# Patient Record
Sex: Female | Born: 2000 | Race: White | Hispanic: No | Marital: Single | Smoking: Never smoker
Health system: Southern US, Community
[De-identification: ages and names within clinical notes are randomized; demographics above are authoritative.]

## PROBLEM LIST (undated history)

## (undated) DIAGNOSIS — F419 Anxiety disorder, unspecified: Secondary | ICD-10-CM

## (undated) DIAGNOSIS — E119 Type 2 diabetes mellitus without complications: Secondary | ICD-10-CM

## (undated) DIAGNOSIS — F32A Depression, unspecified: Secondary | ICD-10-CM

## (undated) HISTORY — DX: Type 2 diabetes mellitus without complications: E11.9

## (undated) HISTORY — DX: Anxiety disorder, unspecified: F41.9

## (undated) HISTORY — DX: Depression, unspecified: F32.A

---
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
## 2020-01-05 ENCOUNTER — Other Ambulatory Visit (HOSPITAL_COMMUNITY): Payer: Self-pay | Admitting: Psychiatry

## 2020-01-05 DIAGNOSIS — F410 Panic disorder [episodic paroxysmal anxiety] without agoraphobia: Secondary | ICD-10-CM

## 2020-02-15 ENCOUNTER — Other Ambulatory Visit (INDEPENDENT_AMBULATORY_CARE_PROVIDER_SITE_OTHER): Payer: Self-pay | Admitting: Pediatrics

## 2020-02-15 DIAGNOSIS — G2569 Other tics of organic origin: Secondary | ICD-10-CM

## 2020-03-01 ENCOUNTER — Ambulatory Visit (INDEPENDENT_AMBULATORY_CARE_PROVIDER_SITE_OTHER): Payer: Medicaid Other | Admitting: Pediatrics

## 2020-03-06 ENCOUNTER — Telehealth (INDEPENDENT_AMBULATORY_CARE_PROVIDER_SITE_OTHER): Payer: Medicaid Other | Admitting: Pediatrics

## 2020-03-08 ENCOUNTER — Telehealth (INDEPENDENT_AMBULATORY_CARE_PROVIDER_SITE_OTHER): Payer: Medicaid Other | Admitting: Psychiatry

## 2020-03-08 ENCOUNTER — Encounter (HOSPITAL_COMMUNITY): Payer: Self-pay | Admitting: Psychiatry

## 2020-03-08 ENCOUNTER — Other Ambulatory Visit: Payer: Self-pay

## 2020-03-08 DIAGNOSIS — F410 Panic disorder [episodic paroxysmal anxiety] without agoraphobia: Secondary | ICD-10-CM

## 2020-03-08 MED ORDER — HYDROXYZINE HCL 25 MG PO TABS: 25.0000 mg | ORAL_TABLET | Freq: Three times a day (TID) | ORAL | 2 refills | Status: DC

## 2020-03-08 MED ORDER — SERTRALINE HCL 100 MG PO TABS: ORAL_TABLET | ORAL | 2 refills | Status: DC

## 2020-03-08 NOTE — Progress Notes (Signed)
BH MD/PA/NP OP Progress Note Virtual Visit via Video Note  I connected with Annell Greening on 03/08/20 at  2:00 PM EST by a video enabled telemedicine application and verified that I am speaking with the correct person using two identifiers.  Location: Patient: Home Provider: Clinic   I discussed the limitations of evaluation and management by telemedicine and the availability of in person appointments. The patient expressed understanding and agreed to proceed.  I provided 30 minutes of non-face-to-face time during this encounter.    03/08/2020 4:29 PM Yilin Weedon  MRN:  [id number]  Chief Complaint: "Things are not so good"  HPI: 20 year old female seen today for follow-up psychiatric evaluation.  Patient has a psychiatric history of ADHD, depression, Tic disorder and severe panic with anxiety. Patient is currently prescribed Pimozide1 mg two times a day(recieves from PCP), Zoloft 150 mg daily, and  hydroxyzine 25 mg three times daily. She notes her medications are effective in managing her psychiatric conditions however notes that she has been feeling sad this week.   Today she is well-groomed, pleasant, cooperative, engaged in conversation, and maintains eye contact.  She informed provider that things are yesterday her boyfriend broke up with her because he wants to focus on school and himself. She notes this saddens her and endorses having increased anxiety and depression. Today provider conducted a GAD 7  and patient scored a 10. Provider also conducted a PHQ 9 and patient scored a 14. Patient endorses passive SI but denies wanting to hurt herself. She denies  SI/HI/AVH or paranoia.    No medication changes made today.  Patient agreeable to continue all medications as prescribed.  She will follow-up with counselor for therapy follow-up for counselinf.  No other concerns noted at this time.  Visit Diagnosis:    ICD-10-CM   1. Severe anxiety with panic  F41.0 sertraline (ZOLOFT) 100 MG  tablet    hydrOXYzine (ATARAX/VISTARIL) 25 MG tablet    Past Psychiatric History: ADHD, depression, personality disorder   Past Medical History: No past medical history on file. No past surgical history on file.  Family Psychiatric History: Paternal grandfather alcohol abuse, paternal uncle ploy substance abuse and suicide, maternal grandmother anxiety    Family History:  Family History  Problem Relation Age of Onset  . Pancreatitis Paternal Grandfather        Died at 56    Social History:  Social History   Socioeconomic History  . Marital status: Single    Spouse name: Not on file  . Number of children: Not on file  . Years of education: Not on file  . Highest education level: Not on file  Occupational History  . Not on file  Tobacco Use  . Smoking status: Never Smoker  . Smokeless tobacco: Never Used  Substance and Sexual Activity  . Alcohol use: No    Alcohol/week: 0.0 standard drinks  . Drug use: No  . Sexual activity: Never  Other Topics Concern  . 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 She is currently not in school   She lives with her parents and siblings.    She enjoys playing on her phone and sleeping.   Social Determinants of Health   Financial Resource Strain: Not on file  Food Insecurity: Not on file  Transportation Needs: Not on file  Physical Activity: Not on file  Stress: Not on file  Social  Connections: Not on file    Allergies: No Known Allergies  Metabolic Disorder Labs: No results found for: HGBA1C, MPG No results found for: PROLACTIN No results found for: CHOL, TRIG, HDL, CHOLHDL, VLDL, LDLCALC No results found for: TSH  Therapeutic Level Labs: No results found for: LITHIUM No results found for: VALPROATE No components found for:  CBMZ  Current Medications: Current Outpatient Medications  Medication Sig Dispense Refill  . docusate sodium  (COLACE) 100 MG capsule Take 100 mg by mouth 2 (two) times daily.    . drospirenone-ethinyl estradiol (NIKKI) 3-0.02 MG tablet TAKE 1 TABLET BY MOUTH EVERY DAY    . hydrOXYzine (ATARAX/VISTARIL) 25 MG tablet Take 1 tablet (25 mg total) by mouth 3 (three) times daily. 90 tablet 2  . loratadine (CLARITIN) 10 MG tablet Take 10 mg by mouth daily.  3  . omeprazole (PRILOSEC) 40 MG capsule   0  . Pimozide 1 MG TABS TAKE 1 TABLET BY MOUTH TWICE A DAY 60 tablet 0  . ranitidine (ZANTAC) 150 MG tablet Take 150 mg by mouth 2 (two) times daily.  0  . sertraline (ZOLOFT) 100 MG tablet TAKE 1.5 TABLETS BY MOUTH DAILY 135 tablet 2   No current facility-administered medications for this visit.     Musculoskeletal: Strength & Muscle Tone: Unable to assess due to telehealth visit Gait & Station: Unable to assess due to telehealth visit Patient leans: N/A  Psychiatric Specialty Exam: Review of Systems  There were no vitals taken for this visit.There is no height or weight on file to calculate BMI.  General Appearance: Well Groomed  Eye Contact:  Good  Speech:  Clear and Coherent and Normal Rate  Volume:  Normal  Mood:  Anxious and Depressed  Affect:  Congruent  Thought Process:  Coherent, Goal Directed and Linear  Orientation:  Full (Time, Place, and Person)  Thought Content: WDL   Suicidal Thoughts:  No  Homicidal Thoughts:  No  Memory:  Immediate;   Good Recent;   Good Remote;   Good  Judgement:  Fair  Insight:  Good  Psychomotor Activity:  Normal  Concentration:  Concentration: Good and Attention Span: Good  Recall:  Good  Fund of Knowledge: Good  Language: Good  Akathisia:  No  Handed:  Right  AIMS (if indicated): Not done  Assets:  Communication Skills Desire for Improvement Financial Resources/Insurance Housing Social Support  ADL's:  Intact  Cognition: WNL  Sleep:  Good   Screenings: GAD-7   Flowsheet Row [facility]08/2020 in [facility]  Total GAD-7 Score 10    PHQ2-9   Flowsheet Row Video Visit [facility] 03/08/2020 in [facility]  PHQ-2 Total Score 4  PHQ-9 Total Score 14       Assessment and Plan: Patient that she has been more anxious and depressed since the break up of her and her boyfriend yesterday. No medication changes made today. Patient agreeable to continue medication as prescribed.  1. Severe anxiety with panic  Continue- sertraline (ZOLOFT) 100 MG tablet; TAKE 1.5 TABLETS BY MOUTH DAILY  Dispense: 135 tablet; Refill: 2 Continue- hydrOXYzine (ATARAX/VISTARIL) 25 MG tablet; Take 1 tablet (25 mg total) by mouth 3 (three) times daily.  Dispense: 90 tablet; Refill: 2    Follow up in 3 month Follow-up with therapy    Shanna Cisco, NP 03/08/2020, 4:29 PM

## 2020-03-17 ENCOUNTER — Encounter (INDEPENDENT_AMBULATORY_CARE_PROVIDER_SITE_OTHER): Payer: Self-pay | Admitting: Pediatrics

## 2020-03-17 ENCOUNTER — Telehealth (INDEPENDENT_AMBULATORY_CARE_PROVIDER_SITE_OTHER): Payer: Medicaid Other | Admitting: Pediatrics

## 2020-03-17 DIAGNOSIS — G2569 Other tics of organic origin: Secondary | ICD-10-CM | POA: Diagnosis not present

## 2020-03-17 MED ORDER — PIMOZIDE 1 MG PO TABS: 1.0000 mg | ORAL_TABLET | Freq: Two times a day (BID) | ORAL | 5 refills | Status: DC

## 2020-03-17 NOTE — Patient Instructions (Signed)
Thank you for persisting so that we could see you today.  I think it would be a good idea for you to see a rheumatologist although I do not think that you have rheumatologic disease.  I do not have a lot of options given that she could not tolerate duloxetine.  I am glad that you are tics are doing well.  I am not going to make changes in pimozide.

## 2020-03-17 NOTE — Progress Notes (Signed)
This is a Pediatric Specialist E-Visit follow up consult provided via Caregility video Marnita Poirier and their parent/guardian Jessaca consented to an E-Visit consult today.  Location of patient: Erica Mcknight is at Home (location) Location of provider: Ellison Carwin, MD is at Office (location) Patient was referred by Clementeen Hoof, NP   The following participants were involved in this E-Visit: [name], her mother, CMA      Ellison Carwin, MD  Total time on call: 20 minutes Follow up: 6 months  Patient: Erica Mcknight MRN: [id number] Sex: female DOB: 2000/09/21  Provider: Ellison Carwin, MD Location of Care: [facility]  Note type: Routine return visit  History of Present Illness: History from: patient and [facility] chart Chief Complaint: Motor tics/musculoskeletal pain/excessive somnolence  Erica Mcknight is a 20 y.o. female who was evaluated March 17, 2020 for the first time since August 24, 2019.  Mirel has diffuse neck and back pain and pain in her arms that is dull and achy.  She also has sharp pains in her right foot which come and go.  She has a history of headaches that she did not complain about today.  These symptoms seem to be quite similar to those that we discussed 6 months ago.  Her motor tics largely involve eyelids which are quite obvious.  On occasion other muscles are involved.  There are no vocal tics.  She stays at home.  She is not going to school nor is she working.  Her general health has been stable.  Her Crohn's disease is not active although she has experienced problems with constipation that required an ED visit.  She has been vaccinated twice for Covid and has a booster scheduled in February.  She has a lump on her breast which will be evaluated by ultrasound in the near future.  She is obese and has acanthosis nigricans she had hemoglobin A1c of 5.3 in October 2021 and a glucose of 105.  Pain keeps her awake her makes it difficult for her to fall  asleep.  She has ongoing problems with sleep.  I tried duloxetine previously but she was not able to tolerate it.  Her mother says that she has been seen by rheumatologist and I think that that is a reasonable idea though I suspect that no rheumatologic disease will be found.  It strange that the history that she gave to my CMA (reflected in the review of systems) is much different from the history that was given to me.  Review of Systems: A complete review of systems was remarkable for patient is here to be seen for fibromyalgia. She reports that she has been doing well since her last visit. SHe states that she has not had any pain really and the medication hasbeen working great. She reports no concerns at this time., all other systems reviewed and negative.  Past Medical History Diagnosis Date  . Anxiety    Phreesia 03/16/2020  . Depression    Phreesia 03/16/2020  . Diabetes mellitus without complication (HCC)    Phreesia 03/16/2020   Hospitalizations: No., Head Injury: No., Nervous System Infections: No., Immunizations up to date: Yes.    Birth History 3 pound infant born at 36 weeks' gestational age to 20 year old gravida 4 para 60 female. Mother had excessive vomiting and was underweight for 5 months. She had spotting throughout the whole pregnancy. She had a cerclage placed between the fifth and sixth months. Labor lasted for 9 hours. Mother received epidural anesthesia. Normal  spontaneous vaginal delivery. The patient had jaundice requiring phototherapy. She did not require a ventilator. She had feeding difficulties. Mother thinks that the ultrasound was normal. The child was fed Enfamil. Growth and development appears to be normal for gross and fine motor skills and language.  Behavior History Attention deficit hyperactivity disorder, combined type, depression  Surgical History History reviewed. No pertinent surgical history.   EGD and colonoscopy January 06, 2020 results  are unknown to me.  Family History family history includes Pancreatitis in her paternal grandfather. Family history is negative for migraines, seizures, intellectual disabilities, blindness, deafness, birth defects, chromosomal disorder, or autism.  Social History Socioeconomic History  . Marital status: Single  . Years of education:  !5  . Highest education level:  Dropped out of high school GED at community college?  Occupational History  . Not employed  Tobacco Use  . Smoking status: Never Smoker  . Smokeless tobacco: Never Used  Substance and Sexual Activity  . Alcohol use: No    Alcohol/week: 0.0 standard drinks  . Drug use: No  . 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 She is currently not in school    She lives with her parents and siblings.     She enjoys playing on her phone and sleeping.   No Known Allergies  Physical Exam Ht 5\' 7"  (1.702 m)   Wt 274 lb (124.3 kg)   BMI 42.91 kg/m   General: alert, well developed, well nourished, in no acute distress, blond hair, blue eyes, right handed Head: normocephalic, no dysmorphic features Neck: supple, full range of motion Musculoskeletal: no skeletal deformities or apparent scoliosis Skin: no rashes or neurocutaneous lesions  Neurologic Exam  Mental Status: alert; oriented to person, place and year; knowledge is normal for age; language is normal Cranial Nerves: visual fields are full to double simultaneous stimuli; extraocular movements are full and conjugate; symmetric facial strength; frequent eyelid blinking; midline tongue and uvula; hearing appears normal bilaterally Motor: normal functional strength, tone and mass; good fine motor movements; no pronator drift Coordination: good finger-to-nose, rapid repetitive alternating movements and finger apposition Gait and Station: normal gait and station: patient is able to walk  on heels, toes and tandem without difficulty; balance is adequate; Romberg exam is negative; Gower response is negative  Assessment 1. Tics of organic origin, G25.69. 2. Fibromyalgia, M79.7. 3. Acanthosis nigricans, L83.  Discussion I have Re: appears to be stable medically and neurologically.  The complaints that she had today are similar to those that she had 6 months ago.  It is interesting that she told my CMA that she was doing well then began to tell me about her aches and pains.  Plan I think it is worthwhile for her to be seen by rheumatologist but I have seen no evidence of rheumatologic disease in terms of joint swelling localized tenderness or erythema.  I think this may represent fibromyalgia but other than duloxetine I am not sure how we might treat it.  I will plan to see her in 6 months.  After that time she will need to see an adult neurologist.  There are many options including the neurology group in [facility] and [facility].  She already has providers both in [place] and at [place].  Greater than 50% of a 20-minute visit was spent in counseling coordination of care concerning her medical problems, reviewing her chart,  and discussing transition of care.  I refilled her prescription for pimozide.   Medication List   Accurate as of March 17, 2020 11:59 PM. If you have any questions, ask your nurse or doctor.    cyanocobalamin 1000 MCG/ML injection Commonly known as: (VITAMIN B-12) Inject into the muscle.   docusate sodium 100 MG capsule Commonly known as: COLACE Take 100 mg by mouth 2 (two) times daily.   doxycycline 100 MG capsule Commonly known as: VIBRAMYCIN Take by mouth.   drospirenone-ethinyl estradiol 3-0.02 MG tablet Commonly known as: YAZ TAKE 1 TABLET BY MOUTH EVERY DAY   ferrous sulfate 325 (65 FE) MG tablet Take by mouth.   hydrOXYzine 25 MG tablet Commonly known as: ATARAX/VISTARIL Take 1 tablet (25 mg total) by mouth 3 (three) times daily.    levonorgestrel-ethinyl estradiol 0.15-0.03 MG tablet Commonly known as: SEASONALE Take by mouth.   loratadine 10 MG tablet Commonly known as: CLARITIN Take 10 mg by mouth daily.   mesalamine 1.2 g EC tablet Commonly known as: LIALDA Take 2 tablets by mouth daily.   omeprazole 40 MG capsule Commonly known as: PRILOSEC   Pimozide 1 MG Tabs Take 1 tablet (1 mg total) by mouth 2 (two) times daily.   ranitidine 150 MG tablet Commonly known as: ZANTAC Take 150 mg by mouth 2 (two) times daily.   sertraline 100 MG tablet Commonly known as: ZOLOFT TAKE 1.5 TABLETS BY MOUTH DAILY    The medication list was reviewed and reconciled. All changes or newly prescribed medications were explained.  A complete medication list was provided to the patient/caregiver.  Deetta Perla MD

## 2020-03-18 ENCOUNTER — Encounter (INDEPENDENT_AMBULATORY_CARE_PROVIDER_SITE_OTHER): Payer: Self-pay | Admitting: Pediatrics

## 2020-03-20 ENCOUNTER — Other Ambulatory Visit: Payer: Self-pay

## 2020-03-20 ENCOUNTER — Ambulatory Visit (HOSPITAL_COMMUNITY): Payer: Medicaid Other | Admitting: Clinical

## 2020-04-03 ENCOUNTER — Ambulatory Visit (HOSPITAL_COMMUNITY): Payer: Medicaid Other | Admitting: Clinical

## 2020-04-05 ENCOUNTER — Encounter (INDEPENDENT_AMBULATORY_CARE_PROVIDER_SITE_OTHER): Payer: Self-pay | Admitting: Pediatrics

## 2020-04-06 ENCOUNTER — Encounter (INDEPENDENT_AMBULATORY_CARE_PROVIDER_SITE_OTHER): Payer: Self-pay | Admitting: Pediatrics

## 2020-06-05 ENCOUNTER — Other Ambulatory Visit: Payer: Self-pay

## 2020-06-05 ENCOUNTER — Telehealth (INDEPENDENT_AMBULATORY_CARE_PROVIDER_SITE_OTHER): Payer: Medicaid Other | Admitting: Psychiatry

## 2020-06-05 ENCOUNTER — Encounter (HOSPITAL_COMMUNITY): Payer: Self-pay | Admitting: Psychiatry

## 2020-06-05 DIAGNOSIS — F410 Panic disorder [episodic paroxysmal anxiety] without agoraphobia: Secondary | ICD-10-CM | POA: Diagnosis not present

## 2020-06-05 DIAGNOSIS — F320 Major depressive disorder, single episode, mild: Secondary | ICD-10-CM | POA: Diagnosis not present

## 2020-06-05 DIAGNOSIS — F32A Depression, unspecified: Secondary | ICD-10-CM

## 2020-06-05 MED ORDER — SERTRALINE HCL 100 MG PO TABS: ORAL_TABLET | ORAL | 2 refills | Status: DC

## 2020-06-05 MED ORDER — HYDROXYZINE HCL 25 MG PO TABS: 25.0000 mg | ORAL_TABLET | Freq: Three times a day (TID) | ORAL | 2 refills | Status: DC

## 2020-06-05 NOTE — Progress Notes (Signed)
BH MD/PA/NP OP Progress Note Virtual Visit via Telephone Note  I connected with Erica Mcknight on 06/05/20 at  3:30 PM EDT by telephone and verified that I am speaking with the correct person using two identifiers.  Location: Patient: home Provider: Clinic   I discussed the limitations, risks, security and privacy concerns of performing an evaluation and management service by telephone and the availability of in person appointments. I also discussed with the patient that there may be a patient responsible charge related to this service. The patient expressed understanding and agreed to proceed.  I provided 30 minutes of non-face-to-face time during this encounter.    06/05/2020 2:41 PM Erica Mcknight  MRN:  [id number]  Chief Complaint: "I had a panic attack the other day."  HPI: 20 year old female seen today for follow-up psychiatric evaluation.  Patient has a psychiatric history of ADHD, depression, Tic disorder and severe panic with anxiety. Patient is currently prescribed Pimozide1 mg two times a day(recieves from PCP), Zoloft 150 mg daily, and  hydroxyzine 25 mg three times daily. She notes her medications are effective in managing her psychiatric conditions.   Today she was unable to logon virtually so her assessment was done over the phone.  During exam she was pleasant, cooperative, and engaged in.  She informed provider that yesterday she had a panic attack.  Writer asked patient what brought her panic attacks on and she notes that she was unsure.  She informed Clinical research associate that prior to yesterday her panic attacks had subsided.  Today provider conducted a GAD-7 and patient scored a 12.  She notes that she worries about her relationships.  She informed Clinical research associate that recently she found out that her ex-boyfriend cheated on her and noted that they broke up.  Provider also conducted a PHQ-9 and patient scored a 10.  She denies SI/HI/VAH or mania.  She notes at times she feels paranoid and thinks that  someone is going to harm her or her family.  Patient endorses hypersomnia noting that she sleeps 10 hours nightly and at times has 5-hour naps in the day.  She endorses adequate appetite.   Patient informed provider that she can cope with above stressors and reports that she would like to continue her medications as prescribed.  No medication changes made today.  She will follow-up with outpatient counseling for therapy.  No other concerns noted at this time.  Visit Diagnosis:    ICD-10-CM   1. Mild depression (HCC)  F32.0 sertraline (ZOLOFT) 100 MG tablet  2. Severe anxiety with panic  F41.0 sertraline (ZOLOFT) 100 MG tablet    hydrOXYzine (ATARAX/VISTARIL) 25 MG tablet    Past Psychiatric History: ADHD, depression, personality disorder   Past Medical History:  Past Medical History:  Diagnosis Date  . Anxiety    Phreesia 03/16/2020  . Depression    Phreesia 03/16/2020  . Diabetes mellitus without complication (HCC)    Phreesia 03/16/2020   No past surgical history on file.  Family Psychiatric History: Paternal grandfather alcohol abuse, paternal uncle ploy substance abuse and suicide, maternal grandmother anxiety    Family History:  Family History  Problem Relation Age of Onset  . Pancreatitis Paternal Grandfather        Died at 76    Social History:  Social History   Socioeconomic History  . Marital status: Single    Spouse name: Not on file  . Number of children: Not on file  . Years of education: Not on file  .  Highest education level: Not on file  Occupational History  . Not on file  Tobacco Use  . Smoking status: Never Smoker  . Smokeless tobacco: Never Used  Substance and Sexual Activity  . Alcohol use: No    Alcohol/week: 0.0 standard drinks  . Drug use: No  . Sexual activity: Never  Other Topics Concern  . 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 She is currently not in  school   She lives with her parents and siblings.    She enjoys playing on her phone and sleeping.   Social Determinants of Health   Financial Resource Strain: Not on file  Food Insecurity: Not on file  Transportation Needs: Not on file  Physical Activity: Not on file  Stress: Not on file  Social Connections: Not on file    Allergies: No Known Allergies  Metabolic Disorder Labs: No results found for: HGBA1C, MPG No results found for: PROLACTIN No results found for: CHOL, TRIG, HDL, CHOLHDL, VLDL, LDLCALC No results found for: TSH  Therapeutic Level Labs: No results found for: LITHIUM No results found for: VALPROATE No components found for:  CBMZ  Current Medications: Current Outpatient Medications  Medication Sig Dispense Refill  . cyanocobalamin (,VITAMIN B-12,) 1000 MCG/ML injection Inject into the muscle.    . docusate sodium (COLACE) 100 MG capsule Take 100 mg by mouth 2 (two) times daily.    Marland Kitchen doxycycline (VIBRAMYCIN) 100 MG capsule Take by mouth.    . drospirenone-ethinyl estradiol (YAZ) 3-0.02 MG tablet TAKE 1 TABLET BY MOUTH EVERY DAY    . ferrous sulfate 325 (65 FE) MG tablet Take by mouth.    . hydrOXYzine (ATARAX/VISTARIL) 25 MG tablet Take 1 tablet (25 mg total) by mouth 3 (three) times daily. 90 tablet 2  . levonorgestrel-ethinyl estradiol (SEASONALE) 0.15-0.03 MG tablet Take by mouth.    . loratadine (CLARITIN) 10 MG tablet Take 10 mg by mouth daily.  3  . mesalamine (LIALDA) 1.2 g EC tablet Take 2 tablets by mouth daily.    Marland Kitchen omeprazole (PRILOSEC) 40 MG capsule   0  . Pimozide 1 MG TABS Take 1 tablet (1 mg total) by mouth 2 (two) times daily. 62 tablet 5  . ranitidine (ZANTAC) 150 MG tablet Take 150 mg by mouth 2 (two) times daily.  0  . sertraline (ZOLOFT) 100 MG tablet TAKE 1.5 TABLETS BY MOUTH DAILY 45 tablet 2   No current facility-administered medications for this visit.     Musculoskeletal: Strength & Muscle Tone: Unable to assess due to telephone  visit Gait & Station: Unable to assess due to telephone visit Patient leans: N/A  Psychiatric Specialty Exam: Review of Systems  There were no vitals taken for this visit.There is no height or weight on file to calculate BMI.  General Appearance: Unable to assess due to telephone visit  Eye Contact:  Unable to assess due to telephone visit  Speech:  Clear and Coherent and Normal Rate  Volume:  Normal  Mood:  Euthymic and Notes that she occasionally becomes anxious and depressed however reports she is able to cope with it  Affect:  Congruent  Thought Process:  Coherent, Goal Directed and Linear  Orientation:  Full (Time, Place, and Person)  Thought Content: WDL, Logical and Paranoid Ideation   Suicidal Thoughts:  No  Homicidal Thoughts:  No  Memory:  Immediate;   Good Recent;  Good Remote;   Good  Judgement:  Fair  Insight:  Good  Psychomotor Activity:  Normal  Concentration:  Concentration: Good and Attention Span: Good  Recall:  Good  Fund of Knowledge: Good  Language: Good  Akathisia:  No  Handed:  Right  AIMS (if indicated): Not done  Assets:  Communication Skills Desire for Improvement Financial Resources/Insurance Housing Social Support  ADL's:  Intact  Cognition: WNL  Sleep:  Fair   Screenings: GAD-7   Flowsheet Row Video Visit from 06/05/2020 in [facility] Video Visit from 03/08/2020 in [facility]  Total GAD-7 Score 12 10    PHQ2-9   Flowsheet Row Video Visit from 06/05/2020 in [facility] Video Visit from 03/08/2020 in [facility]  PHQ-2 Total Score 1 4  PHQ-9 Total Score 10 14    Flowsheet Row Video Visit from 06/05/2020 in [facility]  C-SSRS RISK CATEGORY No Risk       Assessment and Plan: Patient endorses occasional anxiety and depression due to life stressors.  She however reports that she is able to cope  with it.  No medication adjustments made today.  Patient agreeable to continue medication as prescribed.   1. Severe anxiety with panic  Continue- sertraline (ZOLOFT) 100 MG tablet; TAKE 1.5 TABLETS BY MOUTH DAILY  Dispense: 135 tablet; Refill: 2 Continue- hydrOXYzine (ATARAX/VISTARIL) 25 MG tablet; Take 1 tablet (25 mg total) by mouth 3 (three) times daily.  Dispense: 90 tablet; Refill: 2   2. Mild depression (HCC)  Continue- sertraline (ZOLOFT) 100 MG tablet; TAKE 1.5 TABLETS BY MOUTH DAILY  Dispense: 45 tablet; Refill: 2  Follow up in 3 month Follow-up with therapy    Shanna Cisco, NP 06/05/2020, 2:41 PM

## 2020-06-22 ENCOUNTER — Encounter (INDEPENDENT_AMBULATORY_CARE_PROVIDER_SITE_OTHER): Payer: Self-pay

## 2020-07-09 ENCOUNTER — Other Ambulatory Visit (HOSPITAL_COMMUNITY): Payer: Self-pay | Admitting: Psychiatry

## 2020-07-09 DIAGNOSIS — F410 Panic disorder [episodic paroxysmal anxiety] without agoraphobia: Secondary | ICD-10-CM

## 2020-07-13 ENCOUNTER — Ambulatory Visit (HOSPITAL_COMMUNITY): Payer: Medicaid Other | Admitting: Clinical

## 2020-07-27 ENCOUNTER — Ambulatory Visit (HOSPITAL_COMMUNITY): Payer: Medicaid Other | Admitting: Clinical

## 2020-07-27 ENCOUNTER — Other Ambulatory Visit: Payer: Self-pay

## 2020-07-27 ENCOUNTER — Telehealth (HOSPITAL_COMMUNITY): Payer: Self-pay | Admitting: Clinical

## 2020-07-27 NOTE — Telephone Encounter (Signed)
Therapist sent the client a link via mychart for the scheduled virtual therapy visit. Client did not answer. Therapist attempted to call the clients cell phone. Client did not answer. Client does not have a voice mail box set up.

## 2020-08-15 ENCOUNTER — Telehealth (INDEPENDENT_AMBULATORY_CARE_PROVIDER_SITE_OTHER): Payer: Self-pay | Admitting: Pediatrics

## 2020-08-15 NOTE — Telephone Encounter (Signed)
Spoke with patient about her phone message. Informed her that she has one more appointment with Dr. Sharene Skeans but after that she is to find an adult neurologist for herself. She understood

## 2020-08-15 NOTE — Telephone Encounter (Signed)
Who's calling (name and relationship to patient) : Erica Mcknight  Best contact number: [phone]  Provider they see: Dr. Sharene Skeans  Reason for call: Doesn't remember where she was referred to  Call ID:      PRESCRIPTION REFILL ONLY  Name of prescription:  Pharmacy:

## 2020-09-04 ENCOUNTER — Telehealth (INDEPENDENT_AMBULATORY_CARE_PROVIDER_SITE_OTHER): Payer: Medicaid Other | Admitting: Psychiatry

## 2020-09-04 ENCOUNTER — Encounter (HOSPITAL_COMMUNITY): Payer: Self-pay | Admitting: Psychiatry

## 2020-09-04 DIAGNOSIS — F410 Panic disorder [episodic paroxysmal anxiety] without agoraphobia: Secondary | ICD-10-CM | POA: Diagnosis not present

## 2020-09-04 DIAGNOSIS — F320 Major depressive disorder, single episode, mild: Secondary | ICD-10-CM | POA: Diagnosis not present

## 2020-09-04 DIAGNOSIS — F32A Depression, unspecified: Secondary | ICD-10-CM

## 2020-09-04 MED ORDER — SERTRALINE HCL 100 MG PO TABS: ORAL_TABLET | ORAL | 2 refills | Status: DC

## 2020-09-04 MED ORDER — HYDROXYZINE HCL 25 MG PO TABS
25.0000 mg | ORAL_TABLET | Freq: Three times a day (TID) | ORAL | 2 refills | Status: DC
Start: 2020-09-04 — End: 2020-11-27

## 2020-09-04 NOTE — Progress Notes (Signed)
BH MD/PA/NP OP Progress Note Virtual Visit via Telephone Note  I connected with Erica Mcknight on 09/04/20 at  3:30 PM EDT by telephone and verified that I am speaking with the correct person using two identifiers.  Location: Patient: home Provider: Clinic   I discussed the limitations, risks, security and privacy concerns of performing an evaluation and management service by telephone and the availability of in person appointments. I also discussed with the patient that there may be a patient responsible charge related to this service. The patient expressed understanding and agreed to proceed.  I provided 30 minutes of non-face-to-face time during this encounter.    09/04/2020 3:51 PM Erica Mcknight  MRN:  [id number]  Chief Complaint: "I good."  HPI: 20 year old female seen today for follow-up psychiatric evaluation.  Patient has a psychiatric history of ADHD, depression, Tic disorder and severe panic with anxiety. Patient is currently prescribed Pimozide1 mg two times a day(recieves from PCP), Zoloft 150 mg daily, and  hydroxyzine 25 mg three times daily. She notes her medications are effective in managing her psychiatric conditions.   Today she was unable to logon virtually so her assessment was done over the phone.  During exam she was pleasant, cooperative, and engaged in.  She informed provider that she has been doing well since her last visit. She notes that she continues to have minimal anxiety or depression.  Provider conducted a GAD 7 and patient scored a 13, at her last visit she scored a 13. Provider also conducted a PHQ-9 and patient scored a 5, at her last visit she scored a 10.  She denies SI/HI/VAH or mania.  Patient endorses hypersomnia noting that she sleeps 8-10 hours nightly.  She endorses adequate appetite.    No medication changes made today.  Patient agreeable to continue medications as prescribed. She will follow-up with outpatient counseling for therapy.  No other concerns  noted at this time.  Visit Diagnosis:    ICD-10-CM   1. Severe anxiety with panic  F41.0 hydrOXYzine (ATARAX/VISTARIL) 25 MG tablet    sertraline (ZOLOFT) 100 MG tablet    2. Mild depression (HCC)  F32.0 sertraline (ZOLOFT) 100 MG tablet      Past Psychiatric History: ADHD, depression, personality disorder    Past Medical History:  Past Medical History:  Diagnosis Date   Anxiety    Phreesia 03/16/2020   Depression    Phreesia 03/16/2020   Diabetes mellitus without complication (HCC)    Phreesia 03/16/2020   History reviewed. No pertinent surgical history.  Family Psychiatric History: Paternal grandfather alcohol abuse, paternal uncle ploy substance abuse and suicide, maternal grandmother anxiety      Family History:  Family History  Problem Relation Age of Onset   Pancreatitis Paternal Grandfather        Died at 66    Social History:  Social History   Socioeconomic History   Marital status: Single    Spouse name: Not on file   Number of children: Not on file   Years of education: Not on file   Highest education level: Not on file  Occupational History   Not on file  Tobacco Use   Smoking status: Never   Smokeless tobacco: Never  Substance and Sexual Activity   Alcohol use: No    Alcohol/week: 0.0 standard drinks   Drug use: No   Sexual activity: Never  Other Topics Concern   Not on file  Social History Narrative   Joannie is a  high school graduate.   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 She is currently not in school   She lives with her parents and siblings.    She enjoys playing on her phone and sleeping.   Social Determinants of Health   Financial Resource Strain: Not on file  Food Insecurity: Not on file  Transportation Needs: Not on file  Physical Activity: Not on file  Stress: Not on file  Social Connections: Not on file    Allergies: No Known Allergies  Metabolic Disorder Labs: No results found for: HGBA1C, MPG No results found  for: PROLACTIN No results found for: CHOL, TRIG, HDL, CHOLHDL, VLDL, LDLCALC No results found for: TSH  Therapeutic Level Labs: No results found for: LITHIUM No results found for: VALPROATE No components found for:  CBMZ  Current Medications: Current Outpatient Medications  Medication Sig Dispense Refill   cyanocobalamin (,VITAMIN B-12,) 1000 MCG/ML injection Inject into the muscle.     docusate sodium (COLACE) 100 MG capsule Take 100 mg by mouth 2 (two) times daily.     doxycycline (VIBRAMYCIN) 100 MG capsule Take by mouth.     drospirenone-ethinyl estradiol (YAZ) 3-0.02 MG tablet TAKE 1 TABLET BY MOUTH EVERY DAY     ferrous sulfate 325 (65 FE) MG tablet Take by mouth.     hydrOXYzine (ATARAX/VISTARIL) 25 MG tablet Take 1 tablet (25 mg total) by mouth 3 (three) times daily. 90 tablet 2   levonorgestrel-ethinyl estradiol (SEASONALE) 0.15-0.03 MG tablet Take by mouth.     loratadine (CLARITIN) 10 MG tablet Take 10 mg by mouth daily.  3   mesalamine (LIALDA) 1.2 g EC tablet Take 2 tablets by mouth daily.     omeprazole (PRILOSEC) 40 MG capsule   0   Pimozide 1 MG TABS Take 1 tablet (1 mg total) by mouth 2 (two) times daily. 62 tablet 5   ranitidine (ZANTAC) 150 MG tablet Take 150 mg by mouth 2 (two) times daily.  0   sertraline (ZOLOFT) 100 MG tablet TAKE 1.5 TABLETS BY MOUTH DAILY 45 tablet 2   No current facility-administered medications for this visit.     Musculoskeletal: Strength & Muscle Tone:  Unable to assess due to telephone visit Gait & Station:  Unable to assess due to telephone visit Patient leans: N/A  Psychiatric Specialty Exam: Review of Systems  There were no vitals taken for this visit.There is no height or weight on file to calculate BMI.  General Appearance:  Unable to assess due to telephone visit  Eye Contact:   Unable to assess due to telephone visit  Speech:  Clear and Coherent and Normal Rate  Volume:  Normal  Mood:  Euthymic  Affect:  Congruent   Thought Process:  Coherent, Goal Directed and Linear  Orientation:  Full (Time, Place, and Person)  Thought Content: WDL and Logical   Suicidal Thoughts:  No  Homicidal Thoughts:  No  Memory:  Immediate;   Good Recent;   Good Remote;   Good  Judgement:  Fair  Insight:  Good  Psychomotor Activity:  Normal  Concentration:  Concentration: Good and Attention Span: Good  Recall:  Good  Fund of Knowledge: Good  Language: Good  Akathisia:  No  Handed:  Right  AIMS (if indicated): Not done  Assets:  Communication Skills Desire for Improvement Financial Resources/Insurance Housing Social Support  ADL's:  Intact  Cognition: WNL  Sleep:  Good   Screenings: GAD-7    Flowsheet Row Video Visit from 09/04/2020  in [facility] Video Visit from 06/05/2020 in [facility] Video Visit from 03/08/2020 in [facility]  Total GAD-7 Score 13 12 10       PHQ2-9    Flowsheet Row Video Visit from 09/04/2020 in [facility] Video Visit from 06/05/2020 in [facility] Video Visit from 03/08/2020 in [facility]  PHQ-2 Total Score 2 1 4   PHQ-9 Total Score 5 10 14       Flowsheet Row Video Visit from 06/05/2020 in [facility]  C-SSRS RISK CATEGORY No Risk        Assessment and Plan: Patient notes that overall she is doing well.  No medication adjustments made today.  Patient agreeable to continue medication as prescribed.   1. Severe anxiety with panic  Continue- sertraline (ZOLOFT) 100 MG tablet; TAKE 1.5 TABLETS BY MOUTH DAILY  Dispense: 135 tablet; Refill: 2 Continue- hydrOXYzine (ATARAX/VISTARIL) 25 MG tablet; Take 1 tablet (25 mg total) by mouth 3 (three) times daily.  Dispense: 90 tablet; Refill: 2   2. Mild depression (HCC)  Continue- sertraline (ZOLOFT) 100 MG tablet; TAKE 1.5 TABLETS BY  MOUTH DAILY  Dispense: 45 tablet; Refill: 2  Follow up in 3 month Follow-up with therapy    , NP 09/04/2020, 3:51 PM

## 2020-09-21 ENCOUNTER — Encounter (INDEPENDENT_AMBULATORY_CARE_PROVIDER_SITE_OTHER): Payer: Self-pay

## 2020-09-26 ENCOUNTER — Other Ambulatory Visit: Payer: Self-pay

## 2020-09-26 ENCOUNTER — Ambulatory Visit (HOSPITAL_COMMUNITY): Payer: Medicaid Other | Admitting: Clinical

## 2020-10-04 ENCOUNTER — Telehealth (INDEPENDENT_AMBULATORY_CARE_PROVIDER_SITE_OTHER): Payer: Self-pay | Admitting: Pediatrics

## 2020-10-25 ENCOUNTER — Encounter (INDEPENDENT_AMBULATORY_CARE_PROVIDER_SITE_OTHER): Payer: Self-pay

## 2020-10-25 ENCOUNTER — Telehealth (INDEPENDENT_AMBULATORY_CARE_PROVIDER_SITE_OTHER): Payer: Medicaid Other | Admitting: Pediatrics

## 2020-10-25 ENCOUNTER — Encounter (INDEPENDENT_AMBULATORY_CARE_PROVIDER_SITE_OTHER): Payer: Self-pay | Admitting: Pediatrics

## 2020-10-25 VITALS — Wt 280.0 lb

## 2020-10-25 DIAGNOSIS — G2569 Other tics of organic origin: Secondary | ICD-10-CM | POA: Diagnosis not present

## 2020-10-25 MED ORDER — PIMOZIDE 1 MG PO TABS: 1.0000 mg | ORAL_TABLET | Freq: Two times a day (BID) | ORAL | 5 refills | Status: DC

## 2020-10-25 NOTE — Patient Instructions (Signed)
It was a pleasure to see you today.  I sent a message to Dr. Claude Manges requesting that she take you in transfer.  Office number is [phone] she sees patients on the ninth floor of the [facility] in [place].  I sent a prescription to the [facility] pharmacy on [address] in [place].  I hope that was correct.  If not let me know.  Good luck in the future.  I am happy that you are engaged.  I wish you happiness and health always.

## 2020-10-25 NOTE — Progress Notes (Signed)
This is a Pediatric [facility]ist E-Visit follow up consult provided via MyChart video Erica Mcknight consented to an E-Visit consult today.  Location of patient: [name] is at home Location of provider: [facility] Quarto is at [facility] Patient was referred by Clementeen Hoof, NP   The following participants were involved in this E-Visit: Stephani Police (CMA), Dr. Sharene Skeans  This visit was done via VIDEO   Chief Complaint/ Reason for E-Visit today: Tics of organic origin Total time on call: 20 minutes Follow up: With Dr. Claude Manges [facility] movement disorders clinic    Patient: Erica Mcknight MRN: [id number] Sex: female DOB: [birth year]  Provider: Ellison Carwin, MD Location of Care: [facility]  Note type: Routine return visit  History of Present Illness: Referral Source: Clementeen Hoof, NP  History from: patient and [facility] chart Chief Complaint: tics of organic origin  Erica Mcknight is a 20 y.o. female who was evaluated virtually on October 25, 2020 for the first time since March 17, 2020 for motor tic disorder.  She believes that her tics are better since starting pimozide.  She continues to have tics involving shoulder neck and back although I did not witness any today.  She is not having tics involving her face or vocal cords.  She takes and tolerates pimozide without side effects.  She has had problems with Crohn's colitis which has been successfully treated with Humira.  She has problems with depression and is followed by [facility].  I only see her for her tic disorder.  C has obesity.  She recently had an episode of acute otitis externa.  She had COVID in March 2021.  She lives with her parents she is not working or going to school she is engaged to be married and will be married in May 2023.  She spends most of her time in her room.  Review of Systems: A complete review of systems was assessed and was negative  except as noted above and below.  Past Medical History Diagnosis Date   Anxiety    Phreesia 03/16/2020   Depression    Phreesia 03/16/2020   Diabetes mellitus without complication (HCC)    Phreesia 03/16/2020   Hospitalizations: No., Head Injury: No., Nervous System Infections: No., Immunizations up to date: Yes.    Birth History 3 pound infant born at 52 weeks' gestational age to 20 year old gravida 4 para 36 female. Mother had excessive vomiting and was underweight for 5 months. She had spotting throughout the whole pregnancy. She had a cerclage placed between the fifth and sixth months. Labor lasted for 9 hours. Mother received epidural anesthesia. Normal spontaneous vaginal delivery. The patient had jaundice requiring phototherapy. She did not require a ventilator. She had feeding difficulties. Mother thinks that the ultrasound was normal. The child was fed Enfamil. Growth and development appears to be normal for gross and fine motor skills and language.   Behavior History Attention deficit hyperactivity disorder, combined type, depression  Surgical History History reviewed. No pertinent surgical history.  Family History family history includes Pancreatitis in her paternal grandfather. Family history is negative for migraines, seizures, intellectual disabilities, blindness, deafness, birth defects, chromosomal disorder, or autism.  Social History  Socioeconomic History   Marital status: Engaged   Years of education: 15   Highest education level: GED and some community college  Occupational History   Not employed  Tobacco Use   Smoking status: Never   Smokeless tobacco: Never  Substance and  Sexual Activity   Alcohol use: No    Alcohol/week: 0.0 standard drinks   Drug use: No   Sexual activity: Never  Social History Narrative   Erica Mcknight has a high school GED   She attended [employer]. She is currently not in school   She lives with her parents and  siblings.    She enjoys playing on her phone and sleeping.   No Known Allergies  Physical Exam Wt 280 lb (127 kg)   BMI 43.85 kg/m   General: alert, well developed, well nourished, in no acute distress, blond hair, blue eyes, right handed Head: normocephalic, no dysmorphic features Neck: supple, full range of motion Musculoskeletal: no skeletal deformities or apparent scoliosis Skin: no rashes or neurocutaneous lesions  Neurologic Exam  Mental Status: alert; oriented to person, place and year; knowledge is normal for age; language is normal; she initially appeared quite sleepy but became more alert with time Cranial Nerves: visual fields are full to double simultaneous stimuli; extraocular movements are full and conjugate; symmetric facial strength; midline tongue and uvula; hearing appears normal bilaterally; she did not show any facial tic behaviors today Motor: normal functional strength, tone and mass; good fine motor movements; no pronator drift; she had no motor tics Coordination: good finger-to-nose, rapid repetitive alternating movements and finger apposition Gait and Station: normal gait and station: patient is able to walk on heels, toes and tandem without difficulty; balance is adequate; Romberg exam is negative; Gower response is negative  Assessment 1.  Tics of organic origin, G25.69.  Discussion I am pleased that she has responded nicely to pimozide.  Plan I refilled her pimozide today.  Greater than 50% of a 20-minute visit was spent in counseling coordination of care concerning her tics and discussing issues related to transition of care.  I intend to request transition of care with Dr. Claude Manges who has a movement disorders clinic at [facility] and will see young adults.   Medication List    Accurate as of October 25, 2020  2:25 PM. If you have any questions, ask your nurse or doctor.     cyanocobalamin 1000 MCG/ML injection Commonly known as:  (VITAMIN B-12) Inject into the muscle.   docusate sodium 100 MG capsule Commonly known as: COLACE Take 100 mg by mouth 2 (two) times daily.   doxycycline 100 MG capsule Commonly known as: VIBRAMYCIN Take by mouth.   drospirenone-ethinyl estradiol 3-0.02 MG tablet Commonly known as: YAZ TAKE 1 TABLET BY MOUTH EVERY DAY   ferrous sulfate 325 (65 FE) MG tablet Take by mouth.   ferrous sulfate 325 (65 FE) MG tablet Take 1 tablet by mouth daily with breakfast.   Humira Pen 40 MG/0.4ML Pnkt Generic drug: Adalimumab Inject 40 mg into the skin every 14 (fourteen) days.   hydrOXYzine 25 MG tablet Commonly known as: ATARAX/VISTARIL Take 1 tablet by mouth 3 (three) times daily.   hydrOXYzine 25 MG tablet Commonly known as: ATARAX/VISTARIL Take 1 tablet (25 mg total) by mouth 3 (three) times daily.   levonorgestrel-ethinyl estradiol 0.15-0.03 MG tablet Commonly known as: SEASONALE Take by mouth.   loratadine 10 MG tablet Commonly known as: CLARITIN Take 10 mg by mouth daily.   mesalamine 1.2 g EC tablet Commonly known as: LIALDA Take 2 tablets by mouth daily.   omeprazole 40 MG capsule Commonly known as: PRILOSEC   Pimozide 1 MG Tabs Take 1 tablet (1 mg total) by mouth 2 (two) times daily.   ranitidine  150 MG tablet Commonly known as: ZANTAC Take 150 mg by mouth 2 (two) times daily.   sertraline 100 MG tablet Commonly known as: ZOLOFT TAKE 1.5 TABLETS BY MOUTH DAILY What changed: Another medication with the same name was removed. Continue taking this medication, and follow the directions you see here. Changed by: Ellison Carwin, MD     The medication list was reviewed and reconciled. All changes or newly prescribed medications were explained.  A complete medication list was provided to the patient/caregiver.  Deetta Perla MD

## 2020-11-16 ENCOUNTER — Other Ambulatory Visit: Payer: Self-pay

## 2020-11-16 ENCOUNTER — Ambulatory Visit (HOSPITAL_COMMUNITY): Payer: Medicaid Other | Admitting: Clinical

## 2020-11-27 ENCOUNTER — Other Ambulatory Visit: Payer: Self-pay

## 2020-11-27 ENCOUNTER — Encounter (HOSPITAL_COMMUNITY): Payer: Self-pay | Admitting: Psychiatry

## 2020-11-27 ENCOUNTER — Telehealth (INDEPENDENT_AMBULATORY_CARE_PROVIDER_SITE_OTHER): Payer: Medicaid Other | Admitting: Psychiatry

## 2020-11-27 DIAGNOSIS — F32A Depression, unspecified: Secondary | ICD-10-CM

## 2020-11-27 DIAGNOSIS — F410 Panic disorder [episodic paroxysmal anxiety] without agoraphobia: Secondary | ICD-10-CM | POA: Diagnosis not present

## 2020-11-27 MED ORDER — SERTRALINE HCL 100 MG PO TABS: ORAL_TABLET | ORAL | 3 refills | Status: DC

## 2020-11-27 MED ORDER — HYDROXYZINE HCL 25 MG PO TABS: 25.0000 mg | ORAL_TABLET | Freq: Three times a day (TID) | ORAL | 3 refills | Status: DC

## 2020-11-27 NOTE — Progress Notes (Signed)
BH MD/PA/NP OP Progress Note Virtual Visit via Telephone Note  I connected with Erica Mcknight on 09/04/20 at  3:30 PM EDT by telephone and verified that I am speaking with the correct person using two identifiers.  Location: Patient: home Provider: Clinic   I discussed the limitations, risks, security and privacy concerns of performing an evaluation and management service by telephone and the availability of in person appointments. I also discussed with the patient that there may be a patient responsible charge related to this service. The patient expressed understanding and agreed to proceed.  I provided 30 minutes of non-face-to-face time during this encounter.    09/04/2020 3:51 PM Erica Mcknight  MRN:  [id number]  Chief Complaint: "The medications are helping a lot"  HPI: 20 year old female seen today for follow-up psychiatric evaluation.  Patient has a psychiatric history of ADHD, depression, Tic disorder and severe panic with anxiety. Patient is currently prescribed Pimozide1 mg two times a day(recieves from PCP), Zoloft 150 mg daily, and  hydroxyzine 25 mg three times daily. She notes her medications are effective in managing her psychiatric conditions.   Today she was unable to logon virtually so her assessment was done over the phone.  During exam she was pleasant, cooperative, and engaged in.  She informed provider that she has been doing well since her last visit.  She informed Clinical research associate that she feels like the medications are helping a lot.  She notes that she has been more productive and notes that she recently got engaged to her boyfriend.  She notes that they are planning to get married next October.  She informed Clinical research associate that her family and friends are supportive of this upcoming union.  She notes that she has very minimal anxiety and depression.  Provider conducted a GAD 7 and patient scored a 8, at her last visit she scored a 13. Provider also conducted a PHQ-9 and patient scored a 6,  at her last visit she scored a 5.  She denies SI/HI/VAH or mania.  She endorses adequate appetite and sleep.   Patient notes that she spends her time with family and friends.  Currently she notes that she is unemployed and not in school.   No medication changes made today.  Patient agreeable to continue medications as prescribed. She will follow-up with outpatient counseling for therapy.  No other concerns noted at this time.  Visit Diagnosis:    ICD-10-CM   1. Severe anxiety with panic  F41.0 hydrOXYzine (ATARAX/VISTARIL) 25 MG tablet    sertraline (ZOLOFT) 100 MG tablet    2. Mild depression (HCC)  F32.0 sertraline (ZOLOFT) 100 MG tablet      Past Psychiatric History: ADHD, depression, personality disorder    Past Medical History:  Past Medical History:  Diagnosis Date   Anxiety    Phreesia 03/16/2020   Depression    [employer]reesia 03/16/2020   Diabetes mellitus without complication (HCC)    [employer]reesia 03/16/2020   History reviewed. No pertinent surgical history.  Family Psychiatric History: Paternal grandfather alcohol abuse, paternal uncle ploy substance abuse and suicide, maternal grandmother anxiety      Family History:  Family History  Problem Relation Age of Onset   Pancreatitis Paternal Grandfather        Died at 52    Social History:  Social History   Socioeconomic History   Marital status: Single    Spouse name: Not on file   Number of children: Not on file   Years  of education: Not on file   Highest education level: Not on file  Occupational History   Not on file  Tobacco Use   Smoking status: Never   Smokeless tobacco: Never  Substance and Sexual Activity   Alcohol use: No    Alcohol/week: 0.0 standard drinks   Drug use: No   Sexual activity: Never  Other Topics Concern   Not on file  Social History Narrative   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 She is currently not in school   She lives with her parents and  siblings.    She enjoys playing on her phone and sleeping.   Social Determinants of Health   Financial Resource Strain: Not on file  Food Insecurity: Not on file  Transportation Needs: Not on file  Physical Activity: Not on file  Stress: Not on file  Social Connections: Not on file    Allergies: No Known Allergies  Metabolic Disorder Labs: No results found for: HGBA1C, MPG No results found for: PROLACTIN No results found for: CHOL, TRIG, HDL, CHOLHDL, VLDL, LDLCALC No results found for: TSH  Therapeutic Level Labs: No results found for: LITHIUM No results found for: VALPROATE No components found for:  CBMZ  Current Medications: Current Outpatient Medications  Medication Sig Dispense Refill   cyanocobalamin (,VITAMIN B-12,) 1000 MCG/ML injection Inject into the muscle.     docusate sodium (COLACE) 100 MG capsule Take 100 mg by mouth 2 (two) times daily.     doxycycline (VIBRAMYCIN) 100 MG capsule Take by mouth.     drospirenone-ethinyl estradiol (YAZ) 3-0.02 MG tablet TAKE 1 TABLET BY MOUTH EVERY DAY     ferrous sulfate 325 (65 FE) MG tablet Take by mouth.     hydrOXYzine (ATARAX/VISTARIL) 25 MG tablet Take 1 tablet (25 mg total) by mouth 3 (three) times daily. 90 tablet 2   levonorgestrel-ethinyl estradiol (SEASONALE) 0.15-0.03 MG tablet Take by mouth.     loratadine (CLARITIN) 10 MG tablet Take 10 mg by mouth daily.  3   mesalamine (LIALDA) 1.2 g EC tablet Take 2 tablets by mouth daily.     omeprazole (PRILOSEC) 40 MG capsule   0   Pimozide 1 MG TABS Take 1 tablet (1 mg total) by mouth 2 (two) times daily. 62 tablet 5   ranitidine (ZANTAC) 150 MG tablet Take 150 mg by mouth 2 (two) times daily.  0   sertraline (ZOLOFT) 100 MG tablet TAKE 1.5 TABLETS BY MOUTH DAILY 45 tablet 2   No current facility-administered medications for this visit.     Musculoskeletal: Strength & Muscle Tone:  Unable to assess due to telephone visit Gait & Station:  Unable to assess due to  telephone visit Patient leans: N/A  Psychiatric Specialty Exam: Review of Systems  There were no vitals taken for this visit.There is no height or weight on file to calculate BMI.  General Appearance:  Unable to assess due to telephone visit  Eye Contact:   Unable to assess due to telephone visit  Speech:  Clear and Coherent and Normal Rate  Volume:  Normal  Mood:  Euthymic  Affect:  Congruent  Thought Process:  Coherent, Goal Directed and Linear  Orientation:  Full (Time, Place, and Person)  Thought Content: WDL and Logical   Suicidal Thoughts:  No  Homicidal Thoughts:  No  Memory:  Immediate;   Good Recent;   Good Remote;   Good  Judgement:  Fair  Insight:  Good  Psychomotor Activity:  Normal  Concentration:  Concentration: Good and Attention Span: Good  Recall:  Good  Fund of Knowledge: Good  Language: Good  Akathisia:  No  Handed:  Right  AIMS (if indicated): Not done  Assets:  Communication Skills Desire for Improvement Financial Resources/Insurance Housing Social Support  ADL's:  Intact  Cognition: WNL  Sleep:  Good   Screenings: GAD-7    Flowsheet Row Video Visit from 09/04/2020 in [facility] Video Visit from 06/05/2020 in [facility] Video Visit from 03/08/2020 in [facility]  Total GAD-7 Score 13 12 10       PHQ2-9    Flowsheet Row Video Visit from 09/04/2020 in [facility] Video Visit from 06/05/2020 in [facility] Video Visit from 03/08/2020 in [facility]  PHQ-2 Total Score 2 1 4   PHQ-9 Total Score 5 10 14       Flowsheet Row Video Visit from 06/05/2020 in [facility]  C-SSRS RISK CATEGORY No Risk        Assessment and Plan: Patient notes that overall she is doing well.  No medication adjustments made today.  Patient agreeable to continue medication as  prescribed.   1. Severe anxiety with panic  Continue- sertraline (ZOLOFT) 100 MG tablet; TAKE 1.5 TABLETS BY MOUTH DAILY  Dispense: 135 tablet; Refill: 3 Continue- hydrOXYzine (ATARAX/VISTARIL) 25 MG tablet; Take 1 tablet (25 mg total) by mouth 3 (three) times daily.  Dispense: 90 tablet; Refill: 3   2. Mild depression (HCC)  Continue- sertraline (ZOLOFT) 100 MG tablet; TAKE 1.5 TABLETS BY MOUTH DAILY  Dispense: 45 tablet; Refill: 3  Follow up in 3 month Follow-up with therapy    , NP 09/04/2020, 3:51 PM

## 2020-12-04 ENCOUNTER — Ambulatory Visit (INDEPENDENT_AMBULATORY_CARE_PROVIDER_SITE_OTHER): Payer: Medicaid Other | Admitting: Clinical

## 2020-12-04 ENCOUNTER — Other Ambulatory Visit: Payer: Self-pay

## 2020-12-04 DIAGNOSIS — F410 Panic disorder [episodic paroxysmal anxiety] without agoraphobia: Secondary | ICD-10-CM | POA: Diagnosis not present

## 2020-12-04 NOTE — Progress Notes (Signed)
   THERAPIST PROGRESS NOTE Virtual Visit via Video Note  I connected with Erica Mcknight on 12/04/20 at  3:00 PM EDT by a video enabled telemedicine application and verified that I am speaking with the correct person using two identifiers.  Location: Patient: home Provider: office   I discussed the limitations of evaluation and management by telemedicine and the availability of in person appointments. The patient expressed understanding and agreed to proceed.   Follow Up Instructions: I discussed the assessment and treatment plan with the patient. The patient was provided an opportunity to ask questions and all were answered. The patient agreed with the plan and demonstrated an understanding of the instructions.   The patient was advised to call back or seek an in-person evaluation if the symptoms worsen or if the condition fails to improve as anticipated.   Session Time: 16 minutes  Participation Level: Active  Behavioral Response: CasualAlertDepressed  Type of Therapy: Individual Therapy  Treatment Goals addressed: Coping  Interventions: CBT and Supportive  Summary:  Erica Mcknight is a 20 y.o. female who presents for the scheduled session oriented x5, appropriately dressed, and friendly.  Client denied hallucinations and delusions.  Client reported on today she has been doing fairly well.  Client reported she has recently been diagnosed with thyroid disease.  Client reported she was feeling her primary care doctor about it but ultimately her mother helped to see her get placed with the specialist.  Client reported initial feelings of being upset because she was increasingly gaining weight without eating a lot.  Client reported negatively affected her self-esteem exhibited by "when I looked in the mirror and would cry because I was not happy with myself".  Client reported she will be starting her medications soon to help treat the condition.  Client reported she has noted that going out  in public she feels more " paranoid" when eating.  Client thinks that people are looking at her or judging her.  Client reported she is not sure why she has that believe.  Client reported her friend and her fianc have told her that it is a false belief.  Client reported she has gotten engaged to her boyfriend and they plan to get married next year.    Suicidal/Homicidal: Nowithout intent/plan  Therapist Response:  Therapist began the appointment asking the client how she has been doing since last seen. Therapist used CBT to utilize active listening and positive emotional support. Therapist used CBT to ask the client to identify how her health concerns have mental wellbeing. Therapist used CBT to normalize the clients thoughts and emotions towards the stressor. Therapist used CBT to explain how anxiety can reinforce negative beliefs that are not true. Therapist assigned the client homework to use positive self talk while she is out in social situations to challenge her negative believes about herself. Client was scheduled for next appointment.    Plan: Return again in 5 weeks.  Diagnosis: Severe anxiety with panic  Neena Rhymes Carliss Porcaro, LCSW 12/04/2020

## 2020-12-05 ENCOUNTER — Telehealth (INDEPENDENT_AMBULATORY_CARE_PROVIDER_SITE_OTHER): Payer: Self-pay | Admitting: Pediatrics

## 2020-12-05 NOTE — Telephone Encounter (Signed)
Referral has been faxed to Dr Alexis Goodell.

## 2020-12-05 NOTE — Telephone Encounter (Signed)
  Who's calling (name and relationship to patient) : Erica Mcknight (Self) Best contact number: [phone] (Home) Provider they see: Deetta Perla, MD Reason for call: Patient states that referral was never received by practice that dr.hickling was referring out to.  Please fax over referral information so that patient can be scheduled asap Fax number [phone]    PRESCRIPTION REFILL ONLY  Name of prescription:  Pharmacy:

## 2021-02-01 ENCOUNTER — Ambulatory Visit (INDEPENDENT_AMBULATORY_CARE_PROVIDER_SITE_OTHER): Payer: Medicaid Other | Admitting: Clinical

## 2021-02-01 DIAGNOSIS — F32A Depression, unspecified: Secondary | ICD-10-CM

## 2021-02-01 NOTE — Progress Notes (Signed)
° °  THERAPIST PROGRESS NOTE Virtual Visit via Video Note  I connected with Erica Mcknight on 02/01/21 at  3:00 PM EST by a video enabled telemedicine application and verified that I am speaking with the correct person using two identifiers.  Location: Patient: home Provider: office   I discussed the limitations of evaluation and management by telemedicine and the availability of in person appointments. The patient expressed understanding and agreed to proceed.   Follow Up Instructions: I discussed the assessment and treatment plan with the patient. The patient was provided an opportunity to ask questions and all were answered. The patient agreed with the plan and demonstrated an understanding of the instructions.   The patient was advised to call back or seek an in-person evaluation if the symptoms worsen or if the condition fails to improve as anticipated.   Session Time: 16 minutes  Participation Level: Active  Behavioral Response: CasualAlertIrritable  Type of Therapy: Individual Therapy  Treatment Goals addressed: Coping  Interventions: CBT and Supportive  Summary:  Erica Mcknight is a 20 y.o. female who presents for the scheduled session oriented x5, appropriately dressed, and friendly.  Client denied hallucinations and delusions. Client reported on today she is doing fairly well but has been dealing with some stress.  Client reported she has been experiencing some mood swings as a result of stressors in her relationship with her fianc.  Client reported she has been having a hard time letting things go from the past.  Client reported her fianc who was also previously her boyfriend she was referencing with do things that were not appropriate for their relationship and have acts when she would deny.  Client reported her boyfriend has been diagnosed with a medical condition which causes him to have short-term memory loss frequently.  Client reported within the past month for one  example her fianc texted another female and then denied that it happened.  Client reported that she has tried to talk to him about pointing out problems in getting him to except that he does certain things.  Client reported it takes a while but eventually he will admit to those acts and be sorry for things that he has done.  Client reported she is frustrated and mad but is understanding that it would be a condition which she will need to learn to cope with by receiving treatment for himself.     Suicidal/Homicidal: Nowithout intent/plan  Therapist Response:  Therapist began the appointment asking the client how she has been doing since last seen. Therapist used CBT to utilize active listening and positive emotional support towards her thoughts and feelings. Therapist used CBT to engage the client asking to identify the stressors that have negatively impacted the change of her mood. Therapist used CBT to normalize the clients emotional response to the stressor. Therapist assigned client homework to practice conflict resolution by reinforcing positive communication in her relationship and brainstorm collectively on how to improve stressors. Client was scheduled for next appointment.     Plan: Return again in 4 weeks.  Diagnosis: Mild depression    Erica Rhymes Erica Bracken, Erica Mcknight 02/01/2021

## 2021-02-20 ENCOUNTER — Ambulatory Visit (HOSPITAL_COMMUNITY): Payer: Medicaid Other | Admitting: Clinical

## 2021-02-20 ENCOUNTER — Encounter (HOSPITAL_COMMUNITY): Payer: Self-pay

## 2021-02-27 ENCOUNTER — Encounter (HOSPITAL_COMMUNITY): Payer: Self-pay | Admitting: Psychiatry

## 2021-02-27 ENCOUNTER — Telehealth (INDEPENDENT_AMBULATORY_CARE_PROVIDER_SITE_OTHER): Payer: Medicaid Other | Admitting: Psychiatry

## 2021-02-27 DIAGNOSIS — F32A Depression, unspecified: Secondary | ICD-10-CM | POA: Diagnosis not present

## 2021-02-27 DIAGNOSIS — F410 Panic disorder [episodic paroxysmal anxiety] without agoraphobia: Secondary | ICD-10-CM | POA: Diagnosis not present

## 2021-02-27 MED ORDER — HYDROXYZINE HCL 25 MG PO TABS: 25.0000 mg | ORAL_TABLET | Freq: Three times a day (TID) | ORAL | 3 refills | Status: DC

## 2021-02-27 MED ORDER — SERTRALINE HCL 100 MG PO TABS: 200.0000 mg | ORAL_TABLET | Freq: Every day | ORAL | 3 refills | Status: DC

## 2021-02-27 NOTE — Progress Notes (Signed)
BH MD/PA/NP OP Progress Note Virtual Visit via Video Note  I connected with Erica Mcknight on 02/27/21 at  2:00 PM EST by a video enabled telemedicine application and verified that I am speaking with the correct person using two identifiers.  Location: Patient: Store Provider: Clinic   I discussed the limitations of evaluation and management by telemedicine and the availability of in person appointments. The patient expressed understanding and agreed to proceed.  I provided 30 minutes of non-face-to-face time during this encounter.       02/27/2021 2:25 PM Erica Mcknight  MRN:  [id number]  Chief Complaint: "I've been depressed recently"  HPI: 21 year old female seen today for follow-up psychiatric evaluation.  Patient has a psychiatric history of ADHD, depression, Tic disorder and severe panic with anxiety. Patient is currently prescribed Pimozide1 mg two times a day(recieves from PCP), Zoloft 150 mg daily, and  hydroxyzine 25 mg three times daily. She notes her medications are somewhat effective in managing her psychiatric conditions.   Today she was well-groomed, pleasant, cooperative, engaged in conversation, and maintains eye contact.  She informed Clinical research associatewriter that recently she has been depressed.  She notes that she lacks motivation to get out of bed and attend to activities of daily living (eating, bathing, brushing teeth).  She notes that her appetite has been reduced and notes that she has lost approximately 5 to 10 pounds.  She informed Clinical research associatewriter that she sleeps most of the day noting that she goes to sleep at 1 or 2 AM and does not get up again until 6 or 7 PM.  Patient notes that her fianc is a source of her stress.  She informed Clinical research associatewriter that he has been unfaithful and she is concerned about their relationship.  She also notes that she is worried about her independence and finding a job.  Provider [facility]  Today provider conducted GAD-7 and  patient scored 18, at her last visit she scored an 8.  Provider also conducted PHQ-9 and patient scored a 21, at her last visit she scored a 6.  Patient endorses passive SI however notes that she does not want to harm himself.  Today she denies SI/HI/VAH, mania, or paranoia.    Provider in form patient hydroxyzine can be causing daytime sedation.  Provider recommending reducing dose.  Patient however notes that she likes her dose where it is.  Provider then suggest that patient not take medications 3 times daily but instead of 1-2 times if needed.  She endorsed understanding and agreed.  Zoloft was increased from 150 mg to 200 mg to help manage anxiety and depression.  She will follow-up with outpatient counseling for therapy.  No other concerns at this time.      Visit Diagnosis:    ICD-10-CM   1. Severe anxiety with panic  F41.0 hydrOXYzine (ATARAX) 25 MG tablet    sertraline (ZOLOFT) 100 MG tablet    2. Mild depression  F32.A sertraline (ZOLOFT) 100 MG tablet      Past Psychiatric History: ADHD, depression, personality disorder    Past Medical History:  Past Medical History:  Diagnosis Date   Anxiety    Phreesia 03/16/2020   Depression    Phreesia 03/16/2020   Diabetes mellitus without complication (HCC)    Phreesia 03/16/2020   History reviewed. No pertinent surgical history.  Family Psychiatric History: Paternal grandfather alcohol abuse, paternal uncle ploy substance abuse and suicide, maternal grandmother anxiety  Family History:  Family History  Problem Relation Age of Onset   Pancreatitis Paternal Grandfather        Died at 8    Social History:  Social History   Socioeconomic History   Marital status: Single    Spouse name: Not on file   Number of children: Not on file   Years of education: Not on file   Highest education level: Not on file  Occupational History   Not on file  Tobacco Use   Smoking status: Never   Smokeless tobacco: Never  Substance  and Sexual Activity   Alcohol use: No    Alcohol/week: 0.0 standard drinks   Drug use: No   Sexual activity: Never  Other Topics Concern   Not on file  Social History Narrative   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 She is currently not in school   She lives with her parents and siblings.    She enjoys playing on her phone and sleeping.   Social Determinants of Health   Financial Resource Strain: Not on file  Food Insecurity: Not on file  Transportation Needs: Not on file  Physical Activity: Not on file  Stress: Not on file  Social Connections: Not on file    Allergies: No Known Allergies  Metabolic Disorder Labs: No results found for: HGBA1C, MPG No results found for: PROLACTIN No results found for: CHOL, TRIG, HDL, CHOLHDL, VLDL, LDLCALC No results found for: TSH  Therapeutic Level Labs: No results found for: LITHIUM No results found for: VALPROATE No components found for:  CBMZ  Current Medications: Current Outpatient Medications  Medication Sig Dispense Refill   Adalimumab (HUMIRA PEN) 40 MG/0.4ML PNKT Inject 40 mg into the skin every 14 (fourteen) days.     cyanocobalamin (,VITAMIN B-12,) 1000 MCG/ML injection Inject into the muscle. (Patient not taking: Reported on 10/25/2020)     docusate sodium (COLACE) 100 MG capsule Take 100 mg by mouth 2 (two) times daily.     doxycycline (VIBRAMYCIN) 100 MG capsule Take by mouth. (Patient not taking: Reported on 10/25/2020)     drospirenone-ethinyl estradiol (YAZ) 3-0.02 MG tablet TAKE 1 TABLET BY MOUTH EVERY DAY (Patient not taking: Reported on 10/25/2020)     ferrous sulfate 325 (65 FE) MG tablet Take by mouth. (Patient not taking: Reported on 10/25/2020)     ferrous sulfate 325 (65 FE) MG tablet Take 1 tablet by mouth daily with breakfast.     hydrOXYzine (ATARAX) 25 MG tablet Take 1 tablet (25 mg total) by mouth 3 (three) times daily. 90 tablet 3   levonorgestrel-ethinyl estradiol  (SEASONALE) 0.15-0.03 MG tablet Take by mouth. (Patient not taking: Reported on 10/25/2020)     loratadine (CLARITIN) 10 MG tablet Take 10 mg by mouth daily.  3   mesalamine (LIALDA) 1.2 g EC tablet Take 2 tablets by mouth daily.     omeprazole (PRILOSEC) 40 MG capsule  (Patient not taking: Reported on 10/25/2020)  0   Pimozide 1 MG TABS Take 1 tablet (1 mg total) by mouth 2 (two) times daily. 62 tablet 5   ranitidine (ZANTAC) 150 MG tablet Take 150 mg by mouth 2 (two) times daily. (Patient not taking: Reported on 10/25/2020)  0   sertraline (ZOLOFT) 100 MG tablet Take 2 tablets (200 mg total) by mouth daily. 60 tablet 3   No current facility-administered medications for this visit.     Musculoskeletal: Strength & Muscle Tone:  Unable to assess due to telehealth visit Gait & Station:  Unable to assess due to teleheath visit Patient leans: N/A  Psychiatric Specialty Exam: Review of Systems  There were no vitals taken for this visit.There is no height or weight on file to calculate BMI.  General Appearance: Well Groomed  Eye Contact:  Good  Speech:  Clear and Coherent and Normal Rate  Volume:  Normal  Mood:  Euthymic  Affect:  Congruent  Thought Process:  Coherent, Goal Directed and Linear  Orientation:  Full (Time, Place, and Person)  Thought Content: WDL and Logical   Suicidal Thoughts:  Yes.  without intent/plan  Homicidal Thoughts:  No  Memory:  Immediate;   Good Recent;   Good Remote;   Good  Judgement:  Good  Insight:  Good  Psychomotor Activity:  Normal  Concentration:  Concentration: Good and Attention Span: Good  Recall:  Good  Fund of Knowledge: Good  Language: Good  Akathisia:  No  Handed:  Right  AIMS (if indicated): Not done  Assets:  Communication Skills Desire for Improvement Financial Resources/Insurance Housing Social Support  ADL's:  Intact  Cognition: WNL  Sleep:  Fair   Screenings: GAD-7    Flowsheet Row Video Visit from 02/27/2021 in [facility] Video Visit from 11/27/2020 in [facility] Video Visit from 09/04/2020 in [facility] Video Visit from 06/05/2020 in [facility] Video Visit from 03/08/2020 in [facility]  Total GAD-7 Score 18 8 13 12 10       PHQ2-9    Flowsheet Row Video Visit from 02/27/2021 in [facility] Video Visit from 11/27/2020 in [facility] Video Visit from 09/04/2020 in [facility] Video Visit from 06/05/2020 in [facility] Video Visit from 03/08/2020 in [facility]  PHQ-2 Total Score 6 1 2 1 4   PHQ-9 Total Score 21 6 5 10 14       Flowsheet Row Video Visit from 02/27/2021 in [facility] Video Visit from 06/05/2020 in [facility]  C-SSRS RISK CATEGORY Error: Q7 should not be populated when Q6 is No No Risk        Assessment and Plan: Patient endorses daytime sedation, increased anxiety, and depression. Provider in form patient hydroxyzine can be causing daytime sedation.  Provider recommending reducing dose.  Patient however notes that she likes her dose where it is.  Provider then suggest that patient not take medications 3 times daily but instead of 1-2 times if needed.  She endorsed understanding and agreed.  Zoloft was increased from 150 mg to 200 mg to help manage anxiety and depression.    1. Severe anxiety with panic  Continue but only take as needed to prevent sedation- hydrOXYzine (ATARAX) 25 MG tablet; Take 1 tablet (25 mg total) by mouth 3 (three) times daily.  Dispense: 90 tablet; Refill: 3 Continue- sertraline (ZOLOFT) 100 MG tablet; Take 2 tablets (200 mg total) by mouth daily.  Dispense: 60 tablet; Refill: 3  2. Mild depression  Continue- sertraline (ZOLOFT) 100 MG  tablet; Take 2 tablets (200 mg total) by mouth daily.  Dispense: 60 tablet; Refill: 3   Follow up in 3 month Follow-up with therapy    [facility], NP 02/27/2021, 2:25 PM

## 2021-03-16 ENCOUNTER — Other Ambulatory Visit (INDEPENDENT_AMBULATORY_CARE_PROVIDER_SITE_OTHER): Payer: Self-pay

## 2021-03-16 DIAGNOSIS — G2569 Other tics of organic origin: Secondary | ICD-10-CM

## 2021-03-16 MED ORDER — PIMOZIDE 1 MG PO TABS: 1.0000 mg | ORAL_TABLET | Freq: Two times a day (BID) | ORAL | 0 refills | Status: DC

## 2021-03-22 ENCOUNTER — Other Ambulatory Visit (HOSPITAL_COMMUNITY): Payer: Self-pay | Admitting: Psychiatry

## 2021-03-22 DIAGNOSIS — F32A Depression, unspecified: Secondary | ICD-10-CM

## 2021-03-22 DIAGNOSIS — F410 Panic disorder [episodic paroxysmal anxiety] without agoraphobia: Secondary | ICD-10-CM

## 2021-03-31 ENCOUNTER — Emergency Department
Admission: EM | Admit: 2021-03-31 | Discharge: 2021-04-01 | Disposition: A | Discharge status: Home / Self Care | Payer: Medicaid Other | Attending: Emergency Medicine | Admitting: Emergency Medicine

## 2021-03-31 ENCOUNTER — Other Ambulatory Visit: Payer: Self-pay

## 2021-03-31 ENCOUNTER — Encounter: Payer: Self-pay | Admitting: Emergency Medicine

## 2021-03-31 DIAGNOSIS — N6459 Other signs and symptoms in breast: Secondary | ICD-10-CM | POA: Diagnosis present

## 2021-03-31 DIAGNOSIS — E119 Type 2 diabetes mellitus without complications: Secondary | ICD-10-CM | POA: Diagnosis not present

## 2021-03-31 DIAGNOSIS — N611 Abscess of the breast and nipple: Secondary | ICD-10-CM | POA: Insufficient documentation

## 2021-03-31 DIAGNOSIS — N61 Mastitis without abscess: Secondary | ICD-10-CM

## 2021-03-31 NOTE — ED Triage Notes (Signed)
Pt reports about 3 days she noticed a rash on her right breast, reports redness is spreading and is painful. Pt denies any injury to area.

## 2021-04-01 ENCOUNTER — Emergency Department: Payer: Medicaid Other

## 2021-04-01 MED ORDER — CEPHALEXIN 500 MG PO CAPS
500.0000 mg | ORAL_CAPSULE | Freq: Three times a day (TID) | ORAL | 0 refills | Status: AC
Start: 2021-04-01 — End: 2021-04-08

## 2021-04-01 MED ORDER — CEPHALEXIN 500 MG PO CAPS: 500.0000 mg | ORAL_CAPSULE | Freq: Three times a day (TID) | ORAL | 0 refills | Status: DC

## 2021-04-01 MED ORDER — CEPHALEXIN 500 MG PO CAPS
500.0000 mg | ORAL_CAPSULE | Freq: Once | ORAL | Status: AC
  Administered 2021-04-01: 500 mg via ORAL
  Filled 2021-04-01: qty 1

## 2021-04-01 NOTE — Discharge Instructions (Signed)

## 2021-04-01 NOTE — ED Provider Notes (Signed)
[facility] Provider Note    Event Date/Time   First MD Initiated Contact with Patient 04/01/21 0025     (approximate)   History   Cellulitis   HPI  Erica Mcknight is a 21 y.o. female who presents for evaluation of right breast redness.  Patient noticed a rash on the right breast that started 3 days ago.  Initially it was painful and felt hot to the touch.  She was started on Bactrim 2 days ago for a UTI and since then the redness has gotten better, and no longer hurts, and it does not feel hot to the touch.  Patient denies breast-feeding or recent pregnancy.  She does have family history of breast cancer.  Has never had a mammogram before.  Denies any prior infections in her right breast.     Past Medical History:  Diagnosis Date   Anxiety    Phreesia 03/16/2020   Depression    Phreesia 03/16/2020   Diabetes mellitus without complication (HCC)    Phreesia 03/16/2020    History reviewed. No pertinent surgical history.   Physical Exam   Triage Vital Signs: ED Triage Vitals  Enc Vitals Group     BP 03/31/21 2355 135/79     Pulse Rate 03/31/21 2355 93     Resp 03/31/21 2355 16     Temp 03/31/21 2355 98.3 F (36.8 C)     Temp Source 03/31/21 2355 Oral     SpO2 03/31/21 2355 96 %     Weight 03/31/21 2356 300 lb (136.1 kg)     Height 03/31/21 2356 5\' 7"  (1.702 m)     Head Circumference --      Peak Flow --      Pain Score 03/31/21 2356 0     Pain Loc --      Pain Edu? --      Excl. in GC? --     Most recent vital signs: Vitals:   03/31/21 2355 04/01/21 0302  BP: 135/79 135/79  Pulse: 93 93  Resp: 16 18  Temp: 98.3 F (36.8 C)   SpO2: 96% 96%     Constitutional: Alert and oriented. Well appearing and in no apparent distress. HEENT:      Head: Normocephalic and atraumatic.         Eyes: Conjunctivae are normal. Sclera is non-icteric.       Mouth/Throat: Mucous membranes are moist.       Neck: Supple with no signs of  meningismus. Cardiovascular: Regular rate and rhythm.  Respiratory: Normal respiratory effort. Lungs are clear to auscultation bilaterally.  Breast: There is erythema with minimal warmth of the R breast lateral to the nipple, no fluctuance, no significant tenderness. No chest deformity or asymmetry. Normal contours. No nodules, masses, tenderness, or axillary adenopathy. No nipple discharge.  Musculoskeletal:  No edema, cyanosis, or erythema of extremities. Neurologic: Normal speech and language. Face is symmetric. Moving all extremities. No gross focal neurologic deficits are appreciated. Skin: Skin is warm, dry and intact. No rash noted. Psychiatric: Mood and affect are normal. Speech and behavior are normal.  ED Results / Procedures / Treatments   Labs (all labs ordered are listed, but only abnormal results are displayed) Labs Reviewed - No data to display   EKG  none   RADIOLOGY I, 05/30/21, attending MD, have personally viewed and interpreted the images obtained during this visit as below:  Ultrasound with no signs of abscess   ___________________________________________________  Interpretation by Radiologist:  US BREAST LTD UNI RIGHT INC AXILLA  Result Date: 04/01/2021 CLINICAL DATA:  Evaluate for abscess. EXAM: ULTRASOUND OF THE right BREAST COMPARISON:  None FINDINGS: Targeted sonographic images of the right breast was performed for evaluation of abscess. No drainable fluid collection or abscess identified. No architectural distortion or abnormal vascularity. IMPRESSION: No abscess. Electronically Signed   By: Elgie Collard M.D.   On: 04/01/2021 02:13      PROCEDURES:  Critical Care performed: No  Procedures    IMPRESSION / MDM / ASSESSMENT AND PLAN / ED COURSE  I reviewed the triage vital signs and the nursing notes.   21 y.o. female who presents for evaluation of right breast redness.  Patient with redness, minimal warmth, no significant tenderness  or fluctuance of the right breast lateral to the nipple area.  No nipple discharge, no systemic signs and symptoms of infection  Ddx: Cellulitis versus abscess versus malignancy   Plan: Ultrasound of the breast   MEDICATIONS GIVEN IN ED: Medications  cephALEXin (KEFLEX) capsule 500 mg (500 mg Oral Given 04/01/21 0300)     ED COURSE: Ultrasound showing no signs of abscess.  Symptoms seem to be improving on Bactrim for which she was started for actually a UTI 2 days ago.  We will add Keflex and recommend that she continue to take both.  We will refer patient for a mammogram to rule out malignancy.  Recommended close follow-up with PCP.  Discussed my standard return precautions for fever, worsening pain or swelling.  Admission was considered but felt not necessary with no systemic symptoms and an ultrasound with no abscess   Consults: None   EMR reviewed including visit with her PCP from 5 days ago where she was diagnosed with a UTI    FINAL CLINICAL IMPRESSION(S) / ED DIAGNOSES   Final diagnoses:  Breast abscess  Cellulitis of right breast     Rx / DC Orders   ED Discharge Orders          Ordered    cephALEXin (KEFLEX) 500 MG capsule  3 times daily,   Status:  Discontinued        04/01/21 0239    Ambulatory referral to Breast Clinic       Comments: mammogram   04/01/21 0239    cephALEXin (KEFLEX) 500 MG capsule  3 times daily        04/01/21 0243             Note:  This document was prepared using Dragon voice recognition software and may include unintentional dictation errors.   Please note:  Patient was evaluated in Emergency Department today for the symptoms described in the history of present illness. Patient was evaluated in the context of the global COVID-19 pandemic, which necessitated consideration that the patient might be at risk for infection with the SARS-CoV-2 virus that causes COVID-19. Institutional protocols and algorithms that pertain to the  evaluation of patients at risk for COVID-19 are in a state of rapid change based on information released by regulatory bodies including the CDC and federal and state organizations. These policies and algorithms were followed during the patient's care in the ED.  Some ED evaluations and interventions may be delayed as a result of limited staffing during the pandemic.       Don Perking, [place], MD 04/01/21 [phone]

## 2021-04-19 ENCOUNTER — Ambulatory Visit (HOSPITAL_COMMUNITY): Payer: Medicaid Other | Admitting: Clinical

## 2021-05-14 ENCOUNTER — Telehealth (INDEPENDENT_AMBULATORY_CARE_PROVIDER_SITE_OTHER): Payer: Medicaid Other | Admitting: Psychiatry

## 2021-05-14 DIAGNOSIS — F32A Depression, unspecified: Secondary | ICD-10-CM

## 2021-05-14 DIAGNOSIS — F410 Panic disorder [episodic paroxysmal anxiety] without agoraphobia: Secondary | ICD-10-CM | POA: Diagnosis not present

## 2021-05-14 MED ORDER — HYDROXYZINE HCL 10 MG PO TABS: 10.0000 mg | ORAL_TABLET | Freq: Three times a day (TID) | ORAL | 2 refills | Status: DC | PRN

## 2021-05-14 NOTE — Progress Notes (Signed)
BH MD/PA/NP OP Progress Note ? ?05/14/2021 3:32 PM ?Erica Mcknight  ?MRN:  [id number] ? ?Virtual Visit via Telephone Note ? ?I connected with Erica Mcknight on 05/14/21 at  1:30 PM EDT by telephone and verified that I am speaking with the correct person using two identifiers. ? ?Location: ?Patient: home ?Provider: offsite ?  ?I discussed the limitations, risks, security and privacy concerns of performing an evaluation and management service by telephone and the availability of in person appointments. I also discussed with the patient that there may be a patient responsible charge related to this service. The patient expressed understanding and agreed to proceed. ?  ?I discussed the assessment and treatment plan with the patient. The patient was provided an opportunity to ask questions and all were answered. The patient agreed with the plan and demonstrated an understanding of the instructions. ?  ?The patient was advised to call back or seek an in-person evaluation if the symptoms worsen or if the condition fails to improve as anticipated. ? ?I provided 10 minutes of non-face-to-face time during this encounter. ? ? ?Franne Grip, NP  ? ?Chief Complaint: Medication management ? ?HPI: Erica Mcknight is a 21 year old female presenting to [facility] behavioral health outpatient for follow-up psychiatric evaluation.  She has a psychiatric history of ADHD, anxiety with panic and mild depression.  Her symptoms are managed with hydroxyzine 25 mg 3 times daily and sertraline 200 mg daily.  Patient reports that she is med compliant and medications are effective with managing symptoms.  Patient reports increased drowsiness when taken hydroxyzine.  Patient is open to dose reduction of hydroxyzine to 10 mg 3 times daily as needed for anxiety. ?Visit Diagnosis:  ?  ICD-10-CM   ?1. Severe anxiety with panic  F41.0 hydrOXYzine (ATARAX) 10 MG tablet  ?  ?2. Mild depression  F32.A   ?  ? ? ?Past Psychiatric History: ADHD, anxiety with  panic and mild depression ? ?Past Medical History:  ?Past Medical History:  ?Diagnosis Date  ? Anxiety   ? Phreesia 03/16/2020  ? Depression   ? Phreesia 03/16/2020  ? Diabetes mellitus without complication ([place])   ? Phreesia 03/16/2020  ? No past surgical history on file. ? ?Family Psychiatric History: None known  ? ?Family History:  ?Family History  ?Problem Relation Age of Onset  ? Pancreatitis Paternal Grandfather   ?     Died at 66  ? ? ?Social History:  ?Social History  ? ?Socioeconomic History  ? Marital status: Single  ?  Spouse name: Not on file  ? Number of children: Not on file  ? Years of education: Not on file  ? Highest education level: Not on file  ?Occupational History  ? Not on file  ?Tobacco Use  ? Smoking status: Never  ? Smokeless tobacco: Never  ?Substance and Sexual Activity  ? Alcohol use: No  ?  Alcohol/week: 0.0 standard drinks  ? Drug use: No  ? Sexual activity: Never  ?Other Topics Concern  ? Not on file  ?Social History Narrative  ? 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  ? She attended [employer] She is currently not in school  ? She lives with her parents and siblings.   ? She enjoys playing on her phone and sleeping.  ? ?Social Determinants of Health  ? ?Financial Resource Strain: Not on file  ?Food Insecurity: Not on file  ?Transportation Needs: Not on file  ?Physical Activity: Not on file  ?Stress: Not on  file  ?Social Connections: Not on file  ? ? ?Allergies: No Known Allergies ? ?Metabolic Disorder Labs: ?No results found for: HGBA1C, MPG ?No results found for: PROLACTIN ?No results found for: CHOL, TRIG, HDL, CHOLHDL, VLDL, LDLCALC ?No results found for: TSH ? ?Therapeutic Level Labs: ?No results found for: LITHIUM ?No results found for: VALPROATE ?No components found for:  CBMZ ? ?Current Medications: ?Current Outpatient Medications  ?Medication Sig Dispense Refill  ? Adalimumab (HUMIRA PEN) 40 MG/0.4ML PNKT Inject 40 mg into the skin every 14 (fourteen) days.    ?  cyanocobalamin (,VITAMIN B-12,) 1000 MCG/ML injection Inject into the muscle. (Patient not taking: Reported on 10/25/2020)    ? docusate sodium (COLACE) 100 MG capsule Take 100 mg by mouth 2 (two) times daily.    ? doxycycline (VIBRAMYCIN) 100 MG capsule Take by mouth. (Patient not taking: Reported on 10/25/2020)    ? drospirenone-ethinyl estradiol (YAZ) 3-0.02 MG tablet TAKE 1 TABLET BY MOUTH EVERY DAY (Patient not taking: Reported on 10/25/2020)    ? ferrous sulfate 325 (65 FE) MG tablet Take by mouth. (Patient not taking: Reported on 10/25/2020)    ? ferrous sulfate 325 (65 FE) MG tablet Take 1 tablet by mouth daily with breakfast.    ? hydrOXYzine (ATARAX) 10 MG tablet Take 1 tablet (10 mg total) by mouth 3 (three) times daily as needed for anxiety. 90 tablet 2  ? levonorgestrel-ethinyl estradiol (SEASONALE) 0.15-0.03 MG tablet Take by mouth. (Patient not taking: Reported on 10/25/2020)    ? loratadine (CLARITIN) 10 MG tablet Take 10 mg by mouth daily.  3  ? mesalamine (LIALDA) 1.2 g EC tablet Take 2 tablets by mouth daily.    ? omeprazole (PRILOSEC) 40 MG capsule  (Patient not taking: Reported on 10/25/2020)  0  ? Pimozide 1 MG TABS Take 1 tablet (1 mg total) by mouth 2 (two) times daily. 60 tablet 0  ? ranitidine (ZANTAC) 150 MG tablet Take 150 mg by mouth 2 (two) times daily. (Patient not taking: Reported on 10/25/2020)  0  ? sertraline (ZOLOFT) 100 MG tablet TAKE 2 TABLETS BY MOUTH EVERY DAY 180 tablet 2  ? ?No current facility-administered medications for this visit.  ? ? ? ?Musculoskeletal: ?Strength & Muscle Tone: N/A virtual visit ?Gait & Station: N/A virtual visit ?Patient leans: N/A ? ?Psychiatric Specialty Exam: ?Review of Systems  ?Psychiatric/Behavioral:  Negative for dysphoric mood, hallucinations, self-injury and suicidal ideas.   ?All other systems reviewed and are negative.  ?There were no vitals taken for this visit.There is no height or weight on file to calculate BMI.  ?General Appearance: N/A  ?Eye  Contact: N/A  ?Speech: Clear and coherent  ?Volume: Normal  ?Mood: Euthymic  ?Affect: N/A  ?Thought Process: Goal directed  ?Orientation: Full  ?Thought Content: Logical  ?Suicidal Thoughts:  No  ?Homicidal Thoughts:  No  ?Memory: Good  ?Judgement: Good  ?Insight: Good  ?Psychomotor Activity: N/A  ?Concentration: Good  ?Recall: Good  ?Fund of Knowledge: Good  ?Language: Good  ?Akathisia: N/A  ?Handed: Right  ?AIMS (if indicated): not done  ?Assets:  Communication Skills ?Desire for Improvement  ?ADL's:  Intact  ?Cognition: WNL  ?Sleep:  Good  ? ?Screenings: ?GAD-7   ? ?Flowsheet Row Video Visit from 02/27/2021 in [facility] Video Visit from 11/27/2020 in [facility] Video Visit from 09/04/2020 in [facility] Video Visit from 06/05/2020 in [facility] Video Visit  from 03/08/2020 in [facility]  ?Total GAD-7 Score 18 8 13 12 10   ? ?  ? ?PHQ2-9   ? ?Flowsheet Row Video Visit from 02/27/2021 in [facility] Video Visit from 11/27/2020 in [facility] Video Visit from 09/04/2020 in [facility] Video Visit from 06/05/2020 in [facility] Video Visit from 03/08/2020 in [facility]  ?PHQ-2 Total Score 6 1 2 1 4   ?PHQ-9 Total Score 21 6 5 10 14   ? ?  ? ?Flowsheet Row ED from 03/31/2021 in [place] Video Visit from 02/27/2021 in [facility] Video Visit from 06/05/2020 in [facility]  ?C-SSRS RISK CATEGORY No Risk Error: Q7 should not be populated when Q6 is No No Risk  ? ?  ? ? ? ?Assessment and Plan:  Erica Mcknight is a 21 year old female presenting to [facility] behavioral health outpatient for follow-up psychiatric evaluation.  She has a  psychiatric history of ADHD, anxiety with panic and mild depression.  Her symptoms are managed with hydroxyzine 25 mg 3 times daily and sertraline 200 mg daily.  Patient reports that she is med compliant and medications

## 2021-05-15 ENCOUNTER — Ambulatory Visit (INDEPENDENT_AMBULATORY_CARE_PROVIDER_SITE_OTHER): Payer: Medicaid Other | Admitting: Clinical

## 2021-05-15 DIAGNOSIS — F410 Panic disorder [episodic paroxysmal anxiety] without agoraphobia: Secondary | ICD-10-CM

## 2021-05-16 ENCOUNTER — Encounter (HOSPITAL_COMMUNITY): Payer: Self-pay

## 2021-05-16 NOTE — Progress Notes (Signed)
?THERAPIST PROGRESS NOTE ?Virtual Visit via Video Note ? ?I connected with Erica Mcknight on 05/15/2021 at  3:00 PM EDT by a video enabled telemedicine application and verified that I am speaking with the correct person using two identifiers. ? ?Location: ?Patient: home ?Provider: office ?  ?I discussed the limitations of evaluation and management by telemedicine and the availability of in person appointments. The patient expressed understanding and agreed to proceed. ? ? ?Follow Up Instructions: ?I discussed the assessment and treatment plan with the patient. The patient was provided an opportunity to ask questions and all were answered. The patient agreed with the plan and demonstrated an understanding of the instructions. ?  ?The patient was advised to call back or seek an in-person evaluation if the symptoms worsen or if the condition fails to improve as anticipated. ? ? ?Session Time: 30 minutes ? ?Participation Level: Active ? ?Behavioral Response: CasualAlertDepressed ? ?Type of Therapy: Individual Therapy ? ?Treatment Goals addressed: Client will score less than 5 on the generalized anxiety disorder 7 scale ? ?ProgressTowards Goals: Progressing ? ?Interventions: CBT and Supportive ? ?Summary:  ?Erica Mcknight is a 21 y.o. female who presents for the scheduled session oriented x5, appropriately dressed, and friendly.  Client denied hallucinations and delusions. ?Client reported on today she has been managing fairly well but experiencing some stressors. Client reported she and her fianc? have put wedding planning on hold for right now. Client reported her fianc? was diagnosed with spina bifida and was told that although he is in his 62s his cognitive is within teenage years. Client reported there are aspects that are getting better.  Client reported otherwise her self-care has not been going to good.  Client reported she has days where she does not feel like doing anything and does not care how she looks.  Client  reported her mother has also made comments to her about not giving effort to her appearance.  Client reported she struggles with lack of appetite as well.  Client reported her tics have also been giving her more problems lately.  Client reported she has a hard time falling asleep and often stays awake into the early morning.  Client reported she has identified that she struggles with negative self talk about herself.  Client reported she does not feel competent in her physical appearance and wants to do something about it. ?Evidence of progress towards goal: Client reported that she has accomplished the goals of taking steps to make appointments with a medical provider to address her tics and has made an appointment with [facility] to help her with her diet and exercise regimen.  Client reported she is also identified cognitive patterns with negative self talk 7 out of 7 days a week that interfere with her progress. ? ?  05/15/2021  ?  3:22 PM 02/27/2021  ?  2:12 PM 11/27/2020  ? 10:36 AM 09/04/2020  ?  3:40 PM  ?GAD 7 : Generalized Anxiety Score  ?Nervous, Anxious, on Edge 3 3 2 2   ?Control/stop worrying 3 3 3 2   ?Worry too much - different things 3 3 3 3   ?Trouble relaxing 3 2 0 2  ?Restless 3 2 0 1  ?Easily annoyed or irritable 1 2 0 1  ?Afraid - awful might happen 1 3 0 2  ?Total GAD 7 Score 17 18 8 13   ?Anxiety Difficulty Very difficult Somewhat difficult Not difficult at all Not difficult at all  ? ?  ?Flowsheet Row Counselor from 05/15/2021  in [facility]  ?PHQ-9 Total Score 12  ? ?  ?  ? ?Suicidal/Homicidal: Nowithout intent/plan ? ?Therapist Response:  ?Therapist began the appointment asking the client how she has been doing since last seen. ?Therapist used CBT to engage using active listening and positive emotional support towards her thoughts and feelings. ?Therapist used CBT to ask the client to identify challenges in her external environment that have caused her  stressors. ?Therapist used CBT to ask client to identify cognitive patterns that interfere with her progress towards her goals. ?Therapist used CBT to normalize the clients emotional response to stressors and also to discuss challenging negative thoughts to have more balanced cognitive patterns. ?Therapist completed updated [facility]. ?Therapist used CBT ask the client to identify her progress with frequency of use with coping skills with continued practice in her daily activity.    ?Therapist assigned client homework to write about identifying her personal strengths. ?Client was scheduled for next appointment. ? ? ?Plan: Return again in 5 weeks. ? ?Diagnosis: Severe anxiety with panic ? ?Collaboration of Care: Patient refused AEB none identified by the client at this time ? ?Patient/Guardian was advised Release of Information must be obtained prior to any record release in order to collaborate their care with an outside provider. Patient/Guardian was advised if they have not already done so to contact the registration department to sign all necessary forms in order for [place] to release information regarding their care.  ? ?Consent: Patient/Guardian gives verbal consent for treatment and assignment of benefits for services provided during this visit. Patient/Guardian expressed understanding and agreed to proceed.  ? ?Birdena Jubilee Jerrol Helmers, LCSW ?05/15/2021 ? ?

## 2021-05-16 NOTE — Plan of Care (Signed)
Client was in agreement to the treatment plan. °

## 2021-05-31 ENCOUNTER — Emergency Department
Admission: EM | Admit: 2021-05-31 | Discharge: 2021-06-01 | Disposition: A | Discharge status: Home / Self Care | Payer: Medicaid Other | Attending: Emergency Medicine | Admitting: Emergency Medicine

## 2021-05-31 DIAGNOSIS — K920 Hematemesis: Secondary | ICD-10-CM | POA: Diagnosis not present

## 2021-05-31 DIAGNOSIS — R1013 Epigastric pain: Secondary | ICD-10-CM | POA: Diagnosis present

## 2021-05-31 DIAGNOSIS — R531 Weakness: Secondary | ICD-10-CM | POA: Insufficient documentation

## 2021-05-31 DIAGNOSIS — R7401 Elevation of levels of liver transaminase levels: Secondary | ICD-10-CM | POA: Insufficient documentation

## 2021-05-31 DIAGNOSIS — D72829 Elevated white blood cell count, unspecified: Secondary | ICD-10-CM | POA: Insufficient documentation

## 2021-05-31 DIAGNOSIS — E119 Type 2 diabetes mellitus without complications: Secondary | ICD-10-CM | POA: Diagnosis not present

## 2021-05-31 LAB — COMPREHENSIVE METABOLIC PANEL
ALT: 73 U/L — ABNORMAL HIGH (ref 0–44)
AST: 55 U/L — ABNORMAL HIGH (ref 15–41)
Albumin: 4.4 g/dL (ref 3.5–5.0)
Alkaline Phosphatase: 74 U/L (ref 38–126)
Anion gap: 10 (ref 5–15)
BUN: 12 mg/dL (ref 6–20)
CO2: 27 mmol/L (ref 22–32)
Calcium: 9.5 mg/dL (ref 8.9–10.3)
Chloride: 103 mmol/L (ref 98–111)
Creatinine, Ser: 0.76 mg/dL (ref 0.44–1.00)
GFR, Estimated: >60 mL/min (ref >60)
Glucose, Bld: 111 mg/dL — ABNORMAL HIGH (ref 70–99)
Potassium: 4 mmol/L (ref 3.5–5.1)
Sodium: 140 mmol/L (ref 135–145)
Total Bilirubin: 0.8 mg/dL (ref 0.3–1.2)
Total Protein: 8.5 g/dL — ABNORMAL HIGH (ref 6.5–8.1)

## 2021-05-31 LAB — URINALYSIS, ROUTINE W REFLEX MICROSCOPIC
Bacteria, UA: NONE SEEN
Bilirubin Urine: NEGATIVE
Glucose, UA: NEGATIVE mg/dL
Hgb urine dipstick: NEGATIVE
Ketones, ur: NEGATIVE mg/dL
Leukocytes,Ua: NEGATIVE
Nitrite: NEGATIVE
Protein, ur: 100 mg/dL — AB
Specific Gravity, Urine: 1.025 (ref 1.005–1.030)
pH: 5 (ref 5.0–8.0)

## 2021-05-31 LAB — CBC WITH DIFFERENTIAL/PLATELET
Abs Immature Granulocytes: 0.05 10*3/uL (ref 0.00–0.07)
Basophils Absolute: 0.1 10*3/uL (ref 0.0–0.1)
Basophils Relative: 0 %
Eosinophils Absolute: 0.1 10*3/uL (ref 0.0–0.5)
Eosinophils Relative: 1 %
HCT: 43.9 % (ref 36.0–46.0)
Hemoglobin: 13.8 g/dL (ref 12.0–15.0)
Immature Granulocytes: 0 %
Lymphocytes Relative: 23 %
Lymphs Abs: 2.6 10*3/uL (ref 0.7–4.0)
MCH: 25.7 pg — ABNORMAL LOW (ref 26.0–34.0)
MCHC: 31.4 g/dL (ref 30.0–36.0)
MCV: 81.8 fL (ref 80.0–100.0)
Monocytes Absolute: 0.5 10*3/uL (ref 0.1–1.0)
Monocytes Relative: 5 %
Neutro Abs: 8 10*3/uL — ABNORMAL HIGH (ref 1.7–7.7)
Neutrophils Relative %: 71 %
Platelets: 299 10*3/uL (ref 150–400)
RBC: 5.37 MIL/uL — ABNORMAL HIGH (ref 3.87–5.11)
RDW: 14.9 % (ref 11.5–15.5)
WBC: 11.4 10*3/uL — ABNORMAL HIGH (ref 4.0–10.5)
nRBC: 0 % (ref 0.0–0.2)

## 2021-05-31 LAB — TYPE AND SCREEN
ABO/RH(D): AB POS
Antibody Screen: NEGATIVE

## 2021-05-31 LAB — POC URINE PREG, ED: Preg Test, Ur: NEGATIVE

## 2021-05-31 NOTE — ED Triage Notes (Signed)
Pt reports not feeling good all day and reports hemoptysis times 1. Reports chest pain and abdominal pain starting 45 mins ago.  ?

## 2021-06-01 ENCOUNTER — Emergency Department: Payer: Medicaid Other

## 2021-06-01 LAB — LIPASE, BLOOD: Lipase: 35 U/L (ref 11–51)

## 2021-06-01 MED ORDER — SODIUM CHLORIDE 0.9 % IV BOLUS
1000.0000 mL | Freq: Once | INTRAVENOUS | Status: AC
  Administered 2021-06-01: 1000 mL via INTRAVENOUS

## 2021-06-01 MED ORDER — FAMOTIDINE 20 MG PO TABS: 20.0000 mg | ORAL_TABLET | Freq: Two times a day (BID) | ORAL | 0 refills | Status: DC

## 2021-06-01 MED ORDER — FAMOTIDINE IN NACL 20-0.9 MG/50ML-% IV SOLN
20.0000 mg | Freq: Once | INTRAVENOUS | Status: AC
  Administered 2021-06-01: 20 mg via INTRAVENOUS
  Filled 2021-06-01: qty 50

## 2021-06-01 MED ORDER — ONDANSETRON HCL 4 MG PO TABS: 4.0000 mg | ORAL_TABLET | Freq: Every day | ORAL | 1 refills | Status: DC | PRN

## 2021-06-01 MED ORDER — ONDANSETRON HCL 4 MG/2ML IJ SOLN
4.0000 mg | Freq: Once | INTRAMUSCULAR | Status: AC
  Administered 2021-06-01: 4 mg via INTRAVENOUS
  Filled 2021-06-01: qty 2

## 2021-06-01 MED ORDER — IOHEXOL 300 MG/ML  SOLN
100.0000 mL | Freq: Once | INTRAMUSCULAR | Status: AC | PRN
  Administered 2021-06-01: 100 mL via INTRAVENOUS

## 2021-06-01 NOTE — Discharge Instructions (Addendum)
You can take the Zofran as needed for nausea.  Please take the Pepcid twice a day for the next week or so until you are feeling improved which will help with stomach acid.  Please return to the emergency department if you have any further episodes of blood in your vomit or blood in your stool.  Please follow-up with your gastroenterologist. ?

## 2021-06-01 NOTE — ED Provider Notes (Signed)
? ?[facility] ?Provider Note ? ? ? Event Date/Time  ? First MD Initiated Contact with Patient 05/31/21 2349   ?  (approximate) ? ? ?History  ? ?Chest Pain, Hemoptysis, and Abdominal Pain ? ? ?HPI ? ?Erica Mcknight is a 21 y.o. female with past medical history of diabetes and Crohn's disease on Humira presents with abdominal pain generalized weakness and hematemesis.  Patient notes she has been feeling generally unwell all day today.  Has having epigastric pain.  Has been having multiple episodes of emesis as well.  Today around 8 PM she had an episode of vomiting and at the end there was a small amount of bright red blood.  Has never had hematemesis before.  Denies blood in her stool or black stools.  Does have chronic diarrhea from the Crohn's.  Says that this does not feel typical of a Crohn's flare for her which she has not had in several years due to the being on her marrow.  Denies fevers or chills.  Does have a headache currently. ?  ? ?Past Medical History:  ?Diagnosis Date  ? Anxiety   ? Phreesia 03/16/2020  ? Depression   ? Phreesia 03/16/2020  ? Diabetes mellitus without complication ([place])   ? Phreesia 03/16/2020  ? ? ?Patient Active Problem List  ? Diagnosis Date Noted  ? Mild depression 06/05/2020  ? Severe anxiety with panic 08/13/2019  ? Episodic tension-type headache, not intractable 12/23/2017  ? Dysphagia, oropharyngeal 12/23/2017  ? Fibromyalgia 11/28/2014  ? Acanthosis nigricans 04/01/2014  ? Syncope and collapse 05/26/2012  ? 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 Obesity 05/26/2012  ? Attention deficit hyperactivity disorder (ADHD) 05/26/2012  ? 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 ? ? ?Physical Exam  ?Triage Vital Signs: ?ED Triage Vitals  ?Enc Vitals Group  ?   BP 05/31/21 2128 129/87  ?   Pulse Rate 05/31/21 2128 (!) 101  ?   Resp 05/31/21 2128 17  ?   Temp 05/31/21 2128 98.5 ?F (36.9 ?C)  ?   Temp  Source 05/31/21 2128 Oral  ?   SpO2 05/31/21 2128 95 %  ?   Weight --   ?   Height --   ?   Head Circumference --   ?   Peak Flow --   ?   Pain Score 05/31/21 2131 7  ?   Pain Loc --   ?   Pain Edu? --   ?   Excl. in [place]? --   ? ? ?Most recent vital signs: ?Vitals:  ? 06/01/21 0100 06/01/21 0200  ?BP: 120/60 114/61  ?Pulse: 79 74  ?Resp: 17 15  ?Temp:    ?SpO2: 97% 98%  ? ? ? ?General: Awake, no distress.  ?CV:  Good peripheral perfusion.  ?Resp:  Normal effort.  ?Abd:  No distention.  Abdomen is tender throughout primarily in epigastric region ?Neuro:             Awake, Alert, Oriented x 3  ?Other:   ? ? ?ED Results / Procedures / Treatments  ?Labs ?(all labs ordered are listed, but only abnormal results are displayed) ?Labs Reviewed  ?CBC WITH DIFFERENTIAL/PLATELET - Abnormal; Notable for the following components:  ?    Result Value  ? WBC 11.4 (*)   ? RBC 5.37 (*)   ? MCH 25.7 (*)   ? Neutro Abs 8.0 (*)   ?  All other components within normal limits  ?COMPREHENSIVE METABOLIC PANEL - Abnormal; Notable for the following components:  ? Glucose, Bld 111 (*)   ? Total Protein 8.5 (*)   ? AST 55 (*)   ? ALT 73 (*)   ? All other components within normal limits  ?URINALYSIS, ROUTINE W REFLEX MICROSCOPIC - Abnormal; Notable for the following components:  ? Color, Urine YELLOW (*)   ? APPearance CLOUDY (*)   ? Protein, ur 100 (*)   ? All other components within normal limits  ?LIPASE, BLOOD  ?POC URINE PREG, ED  ?TYPE AND SCREEN  ? ? ? ?EKG ? ? ? ? ?RADIOLOGY ? ? ? ?PROCEDURES: ? ?Critical Care performed: No ? ?Procedures ? ?The patient is on the cardiac monitor to evaluate for evidence of arrhythmia and/or significant heart rate changes. ? ? ?MEDICATIONS ORDERED IN ED: ?Medications  ?sodium chloride 0.9 % bolus 1,000 mL (0 mLs Intravenous Stopped 06/01/21 0213)  ?ondansetron [facility]) injection 4 mg (4 mg Intravenous Given 06/01/21 0059)  ?famotidine (PEPCID) IVPB 20 mg premix (0 mg Intravenous Stopped 06/01/21 0133)  ?iohexol  (OMNIPAQUE) 300 MG/ML solution 100 mL (100 mLs Intravenous Contrast Given 06/01/21 0045)  ? ? ? ?IMPRESSION / MDM / ASSESSMENT AND PLAN / ED COURSE  ?I reviewed the triage vital signs and the nursing notes. ?             ?               ? ?Differential diagnosis includes, but is not limited to, gastritis, Mallory-Weiss tear, peptic ulcer disease, Crohn's flare, gastroenteritis secondary to virus ? ?Patient is a 21 year old female with a history of Crohn's disease on Humira presents with nausea vomiting abdominal pain in epigastrium as well as an episode of hematemesis.  Patient shows me a picture of the hematemesis and overall I am reassured there is a small maybe a teaspoon of what looks like mucosa and not really like bright red blood certainly not coffee-ground emesis.  She refuses a rectal exam today.  Does have tenderness really throughout the abdomen and with her history of Crohn's we will obtain a CT abdomen pelvis to rule out complication from Crohn's.  Overall labs are reassuring she has normal hemoglobin, LFTs just mildly elevated AST and ALT.  Leukocytosis of 11.4 but normal hemoglobin.  Pregnancy test is negative.  Will give fluids antinausea medicine Pepcid and obtain CT. ? ?  ?CT abdomen pelvis is without acute abnormality. Lipase is normal.  On reassessment patient feeling improved she is tolerating p.o.  Will discharge with prescription for Zofran and Pepcid.  Discussed return precautions for any further episodes of hematemesis or blood in her stool.  She is appropriate for discharge at this time. ? ?FINAL CLINICAL IMPRESSION(S) / ED DIAGNOSES  ? ?Final diagnoses:  ?Epigastric pain  ? ? ? ?Rx / DC Orders  ? ?ED Discharge Orders   ? ?      Ordered  ?  famotidine (PEPCID) 20 MG tablet  2 times daily       ? 06/01/21 0232  ?  ondansetron (ZOFRAN) 4 MG tablet  Daily PRN       ? 06/01/21 0232  ? ?  ?  ? ?  ? ? ? ?Note:  This document was prepared using Dragon voice recognition software and may include  unintentional dictation errors. ?  ?Rada Hay, MD ?06/01/21 [phone] ? ?

## 2021-06-01 NOTE — ED Notes (Signed)
Pt passed PO challenge.

## 2021-06-27 ENCOUNTER — Telehealth (HOSPITAL_COMMUNITY): Payer: Self-pay | Admitting: *Deleted

## 2021-06-27 ENCOUNTER — Other Ambulatory Visit (HOSPITAL_COMMUNITY): Payer: Self-pay | Admitting: Psychiatry

## 2021-06-27 DIAGNOSIS — F410 Panic disorder [episodic paroxysmal anxiety] without agoraphobia: Secondary | ICD-10-CM

## 2021-06-27 MED ORDER — HYDROXYZINE HCL 10 MG PO TABS: 10.0000 mg | ORAL_TABLET | Freq: Three times a day (TID) | ORAL | 0 refills | Status: DC | PRN

## 2021-06-27 NOTE — Telephone Encounter (Signed)
Pharmacy request from [facility] for a 90 day supply of her Hydroxyzine. She was seen on 3/27 and returns on 6/20. She has MCD and I suspect her MCD charges the same for 90 days as 30 days. ?

## 2021-06-27 NOTE — Progress Notes (Signed)
90 day supply of hydroxyzine sent to pharmacy for cost effectiveness.  ?

## 2021-07-18 ENCOUNTER — Other Ambulatory Visit (INDEPENDENT_AMBULATORY_CARE_PROVIDER_SITE_OTHER): Payer: Self-pay | Admitting: Pediatrics

## 2021-07-18 DIAGNOSIS — G2569 Other tics of organic origin: Secondary | ICD-10-CM

## 2021-07-18 NOTE — Telephone Encounter (Signed)
Please ask patient when she is scheduled to see Dr Consuello Bossier. I will give enough medication to get her to that appointment but I need to know when it is. If she does not have an appointment with that provider, she needs to schedule it and share the date with me so the refill can be done. Thanks, Otila Kluver

## 2021-07-18 NOTE — Telephone Encounter (Signed)
  Name of who is calling: Annell Greening Caller's Relationship to Patient: self Best contact number: [phone]  Provider they see: Dr. Sharene Skeans  Reason for call: [name] called in stating that her appt was cancelled to where Dr. Sharene Skeans referred her and that they won't refill her prescription.     PRESCRIPTION REFILL ONLY  Name of prescription:  Pharmacy:

## 2021-07-18 NOTE — Telephone Encounter (Signed)
Then I will need to see her in order to give a refill. Please ask if she can come in tomorrow at 11:30 or any time Friday morning. Thanks, Inetta Fermo

## 2021-07-19 ENCOUNTER — Ambulatory Visit (INDEPENDENT_AMBULATORY_CARE_PROVIDER_SITE_OTHER): Payer: Medicaid Other | Admitting: Family

## 2021-07-19 ENCOUNTER — Encounter (INDEPENDENT_AMBULATORY_CARE_PROVIDER_SITE_OTHER): Payer: Self-pay | Admitting: Family

## 2021-07-19 VITALS — BP 100/60 | HR 99 | Wt 298.7 lb

## 2021-07-19 DIAGNOSIS — G2569 Other tics of organic origin: Secondary | ICD-10-CM

## 2021-07-19 MED ORDER — PIMOZIDE 1 MG PO TABS: 1.0000 mg | ORAL_TABLET | Freq: Two times a day (BID) | ORAL | 1 refills | Status: AC

## 2021-07-19 NOTE — Patient Instructions (Signed)
It was a pleasure to see you today!  Instructions for you until your next appointment are as follows: I will refill the Pimozide for 1 month. Call Dr Raiford Noble office at [phone] to schedule an appointment. A referral was sent to that office by Dr Sharene Skeans at your last visit with him. Call me and let me know when you are scheduled with Dr Alexis Goodell. Please sign up for MyChart if you have not done so.  Feel free to contact our office during normal business hours at [phone] with questions or concerns. If there is no answer or the call is outside business hours, please leave a message and our clinic staff will call you back within the next business day.  If you have an urgent concern, please stay on the line for our after-hours answering service and ask for the on-call neurologist.     I also encourage you to use MyChart to communicate with me more directly. If you have not yet signed up for MyChart within [facility], the front desk staff can help you. However, please note that this inbox is NOT monitored on nights or weekends, and response can take up to 2 business days.  Urgent matters should be discussed with the on-call pediatric neurologist.   At [facility], we are committed to providing exceptional care. You will receive a patient satisfaction survey through text or email regarding your visit today. Your opinion is important to me. Comments are appreciated.

## 2021-07-19 NOTE — Progress Notes (Signed)
Erica Mcknight   MRN:  [id number]  September 23, 2000   Provider: Elveria Rising NP-C Location of Care: [facility] Child Neurology  Visit type: Return visit   Last visit: 10/25/2020 with Dr Sharene Skeans  Referral source: Gennette Pac, NP History from: Epic chart and patient  Brief history:  Copied from previous record: History of motor tic disorder that have improved on Pimozide. In the past, tics have consisted of rapid eye movements, twisting of her head and neck, eye rolling, stretching of her mouth, squinching her nose, sniffing behavior, twisting and twitching in her extremities and intermittent loud screams.  She also has history of ADHD, obesity, headaches, PCOS, body pain, problems initiating sleep and Crohn's colitis (treated with Humira). She has ongoing depression and is followed by [facility].   Today's concerns: Erica Mcknight reports today that she has been out of Pimozide for over a month because the prescription expired. She was referred to Dr Alexis Goodell by Dr Sharene Skeans before he retired, but says that she has not yet been scheduled for an appointment at that practice. Erica Mcknight says that the tics did not return immediately after she ran out of Pimozide but that they turned gradually and are getting worse each day. She is having pain in her extremities because of the twisting motions.   Erica Mcknight reports that she does not drive because of fear of the twisting motions and jerking behavior causing her to be unable to maintain control of a vehicle. She is not employed because of the tics and because she does not have a high school diploma.   Erica Mcknight has been otherwise generally healthy since she was last seen. She has no other health concerns today other than previously mentioned.  Review of systems: Please see HPI for neurologic and other pertinent review of systems. Otherwise all other systems were reviewed and were negative.  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 Mother had excessive vomiting and was underweight for 5 months. She had spotting throughout the whole pregnancy. She had a cerclage placed between the fifth and sixth months. Labor lasted for 9 hours. Mother received epidural anesthesia. Normal spontaneous vaginal delivery. The patient had jaundice requiring phototherapy. She did not require a ventilator. She had feeding difficulties. Mother thinks that the ultrasound was normal. The child was fed Enfamil. Growth and development appears to be normal for gross and fine motor skills and language.   Behavior History Attention deficit hyperactivity disorder, combined type, depression  Surgical history: No past surgical history on file.   Family history: family history includes Pancreatitis in her paternal grandfather.   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 She is currently not in school   She lives with her parents and siblings.    She enjoys playing on her phone and sleeping.   Social Determinants of Health   Financial Resource Strain: Not on file  Food Insecurity: Not on file  Transportation Needs: Not on file  Physical Activity: Not on file  Stress: Not on file  Social Connections: Not on file  Intimate Partner Violence: Not on file    Past/failed meds: Copied from previous record: Duloxetine - side effects Guanfacine - ineffective Intuniv - ineffective Clonidine - ineffective  Allergies: No Known Allergies   Immunizations: Immunization History  Administered Date(s) Administered   PFIZER(Purple Top)SARS-COV-2 Vaccination 08/11/2019, 09/01/2019, 03/24/2020    Diagnostics/Screenings:   Physical Exam: BP 100/60   Pulse 99   Wt 298 lb 11.6 oz (135.5 kg)   BMI 46.79 kg/m   General: Well developed, well nourished obese young woman, seated in exam room, in no evident distress Head: Head normocephalic and atraumatic.  Oropharynx benign. Neck: Supple Cardiovascular: Regular rate and rhythm, no murmurs Respiratory: Breath sounds clear to auscultation Musculoskeletal: No obvious deformities or scoliosis Skin: No rashes or neurocutaneous lesions  Neurologic Exam Mental Status: Awake and fully alert.  Oriented to place and time.  Recent and remote memory intact.  Attention span, concentration, and fund of knowledge appropriate.  Mood and affect appropriate. Cranial Nerves: Fundoscopic exam reveals sharp disc margins.  Pupils equal, briskly reactive to light.  Extraocular movements full without nystagmus. Hearing intact  and symmetric to whisper.  Facial sensation intact.  Face tongue, palate move normally and symmetrically. Shoulder shrug normal Motor: Normal bulk and tone. Normal strength in all tested extremity muscles. She has frequent twisting motions in her extremities and episodes of rapid eye blinking.  Sensory: Intact to touch and temperature in all extremities.  Coordination: Rapid alternating movements normal in all extremities.  Finger-to-nose and heel-to shin performed accurately bilaterally.  Romberg negative. Gait and Station: Arises from chair without difficulty.  Stance is normal. Gait demonstrates normal stride length and balance.   Able to heel, toe and tandem walk without difficulty. Reflexes: 1+ and symmetric. Toes downgoing.   Impression: Tics of organic origin - Plan: Pimozide 1 MG TABS   Recommendations for plan of care: The patient's previous Epic records were reviewed. Erica Mcknight has neither had nor required imaging or lab studies since the last visit. She needs refill on Pimozide as the motor tics are increasing in frequency and severity since being off the medication for over a month. I told her that I will refill it but that she has been referred to an adult neurology provider (Dr Alexis Goodell) and that she must call that office to schedule an appointment. I asked her to do that today and to call and let me know when the appointment  has been scheduled. I will refill the medication until she is established with that practice. Erica Mcknight agreed with the plans made today.  The medication list was reviewed and reconciled. No changes were made in the prescribed medications today. A complete medication list was provided to the patient.  Allergies as of 07/19/2021   No Known Allergies      Medication List        Accurate as of July 19, 2021  7:12 PM. If you have any questions, ask your nurse or doctor.          STOP taking these medications    cyanocobalamin 1000 MCG/ML injection Commonly  known as: (VITAMIN B-12) Stopped by: Elveria Risingina Shellee Streng, NP   docusate sodium 100 MG capsule Commonly known as: COLACE Stopped by: Elveria Risingina Chisom Muntean, NP   doxycycline 100 MG capsule Commonly known as: VIBRAMYCIN Stopped by: Elveria Risingina Pricsilla Lindvall, NP   drospirenone-ethinyl estradiol 3-0.02 MG tablet Commonly known as: YAZ Stopped by: Elveria Risingina Lataja Newland, NP   famotidine 20 MG tablet Commonly known as: PEPCID Stopped by: Elveria Risingina Abu Heavin, NP   ferrous sulfate 325 (65 FE) MG tablet Stopped by: Elveria Risingina Jaylynne Birkhead, NP   levonorgestrel-ethinyl estradiol 0.15-0.03 MG tablet Commonly known as: SEASONALE Stopped by: Elveria Risingina Jarrod Bodkins, NP   loratadine 10 MG tablet Commonly known as: CLARITIN Stopped by: Elveria Risingina Alixandrea Milleson, NP   mesalamine 1.2 g EC tablet Commonly known as: LIALDA Stopped by: Elveria Risingina Lendy Dittrich, NP   ondansetron 4 MG tablet Commonly known as: Zofran Stopped by: Elveria Risingina Katha Kuehne, NP   ranitidine 150 MG tablet Commonly known as: ZANTAC Stopped by: Elveria Risingina Maison Kestenbaum, NP       TAKE these medications    cholecalciferol 25 MCG (1000 UNIT) tablet Commonly known as: VITAMIN D3 Take 1,000 Units by mouth daily.   Humira Pen 40 MG/0.4ML Pnkt Generic drug: Adalimumab Inject 40 mg into the skin every 14 (fourteen) days.   hydrOXYzine 10 MG tablet Commonly known as: ATARAX Take 1 tablet (10 mg total) by mouth 3 (three) times daily as needed for anxiety.   levothyroxine 88 MCG tablet Commonly known as: SYNTHROID Take 88 mcg by mouth daily.   omeprazole 40 MG capsule Commonly known as: PRILOSEC Take 40 mg by mouth in the morning and at bedtime.   Pimozide 1 MG Tabs Take 1 tablet (1 mg total) by mouth 2 (two) times daily.   sertraline 100 MG tablet Commonly known as: ZOLOFT TAKE 2 TABLETS BY MOUTH EVERY DAY      Total time spent with the patient was 25 minutes, of which 50% or more was spent in counseling and coordination of care.  Elveria Risingina Venna Berberich NP-C [facility]  [facility]rology Ph. [phone] Fax [phone]

## 2021-07-26 ENCOUNTER — Encounter (HOSPITAL_COMMUNITY): Payer: Self-pay

## 2021-07-26 ENCOUNTER — Ambulatory Visit (HOSPITAL_COMMUNITY): Payer: Medicaid Other | Admitting: Clinical

## 2021-08-07 ENCOUNTER — Encounter (HOSPITAL_COMMUNITY): Payer: Self-pay | Admitting: Psychiatry

## 2021-08-07 ENCOUNTER — Telehealth (INDEPENDENT_AMBULATORY_CARE_PROVIDER_SITE_OTHER): Payer: Medicaid Other | Admitting: Psychiatry

## 2021-08-07 DIAGNOSIS — F32A Depression, unspecified: Secondary | ICD-10-CM

## 2021-08-07 DIAGNOSIS — F410 Panic disorder [episodic paroxysmal anxiety] without agoraphobia: Secondary | ICD-10-CM

## 2021-08-07 MED ORDER — SERTRALINE HCL 100 MG PO TABS: 200.0000 mg | ORAL_TABLET | Freq: Every day | ORAL | 3 refills | Status: DC

## 2021-08-07 MED ORDER — HYDROXYZINE HCL 10 MG PO TABS: 10.0000 mg | ORAL_TABLET | Freq: Three times a day (TID) | ORAL | 3 refills | Status: DC | PRN

## 2021-08-07 NOTE — Progress Notes (Signed)
BH MD/PA/NP OP Progress Note Virtual Visit via Video Note  I connected with Erica Mcknight on 08/07/21 at  2:30 PM EDT by a video enabled telemedicine application and verified that I am speaking with the correct person using two identifiers.  Location: Patient: Erica Mcknight Provider: Clinic   I discussed the limitations of evaluation and management by telemedicine and the availability of in person appointments. The patient expressed understanding and agreed to proceed.  I provided 30 minutes of non-face-to-face time during this encounter.       08/07/2021 2:37 PM Erica Mcknight  MRN:  [id number]  Chief Complaint: "I have anxiety but I can deal with it"  HPI: 21 year old female seen today for follow-up psychiatric evaluation.  Patient has a psychiatric history of ADHD, depression, Tic disorder and severe panic with anxiety. Patient is currently prescribed Pimozide1 mg two times a day(recieves from PCP), Zoloft 200 mg daily, and  hydroxyzine 10 mg three times daily. She notes her medications are effective in managing her psychiatric conditions.   Today she was well-groomed, pleasant, cooperative, engaged in conversation, and maintains eye contact.  She informed Clinical research associate that deals with anxiety daily but notes that she is able to cope with it.  Provider conducted GAD-7 and patient scored a 20.  She notes that her depression is minimal and scored an 11 on her PHQ-9.  She endorsed adequate sleep and appetite.  Today she denies SI/HI/VAH, mania, or paranoia.   Patient informed Clinical research associate that she continues to go to [facility] for nutrition and weight loss support.  She informed Clinical research associate that she finds enjoyment there as she has lost a little weight.   No medication changes made today.  Patient agreeable to continue medication as prescribed.  Patient will follow-up with outpatient counseling for therapy.  No other concerns noted at this time.    Visit Diagnosis:    ICD-10-CM   1. Severe anxiety with panic   F41.0 hydrOXYzine (ATARAX) 10 MG tablet    sertraline (ZOLOFT) 100 MG tablet    2. Mild depression  F32.A sertraline (ZOLOFT) 100 MG tablet      Past Psychiatric History: ADHD, depression, personality disorder    Past Medical History:  Past Medical History:  Diagnosis Date   Anxiety    Phreesia 03/16/2020   Depression    Phreesia 03/16/2020   Diabetes mellitus without complication (HCC)    Phreesia 03/16/2020   History reviewed. No pertinent surgical history.  Family Psychiatric History: Paternal grandfather alcohol abuse, paternal uncle ploy substance abuse and suicide, maternal grandmother anxiety      Family History:  Family History  Problem Relation Age of Onset   Pancreatitis Paternal Grandfather        Died at 28    Social History:  Social History   Socioeconomic History   Marital status: Single    Spouse name: Not on file   Number of children: Not on file   Years of education: Not on file   Highest education level: Not on file  Occupational History   Not on file  Tobacco Use   Smoking status: Never   Smokeless tobacco: Never  Substance and Sexual Activity   Alcohol use: No    Alcohol/week: 0.0 standard drinks of alcohol   Drug use: No   Sexual activity: Never  Other Topics Concern   Not on file  Social History Narrative   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 She is  currently not in school   She lives with her parents and siblings.    She enjoys playing on her phone and sleeping.   Social Determinants of Health   Financial Resource Strain: Not on file  Food Insecurity: Not on file  Transportation Needs: Not on file  Physical Activity: Not on file  Stress: Not on file  Social Connections: Not on file    Allergies: No Known Allergies  Metabolic Disorder Labs: No results found for: "HGBA1C", "MPG" No results found for: "PROLACTIN" No results found for: "CHOL", "TRIG", "HDL", "CHOLHDL", "VLDL",  "LDLCALC" No results found for: "TSH"  Therapeutic Level Labs: No results found for: "LITHIUM" No results found for: "VALPROATE" No results found for: "CBMZ"  Current Medications: Current Outpatient Medications  Medication Sig Dispense Refill   Adalimumab (HUMIRA PEN) 40 MG/0.4ML PNKT Inject 40 mg into the skin every 14 (fourteen) days.     cholecalciferol (VITAMIN D3) 25 MCG (1000 UNIT) tablet Take 1,000 Units by mouth daily.     hydrOXYzine (ATARAX) 10 MG tablet Take 1 tablet (10 mg total) by mouth 3 (three) times daily as needed for anxiety. 90 tablet 3   levothyroxine (SYNTHROID) 88 MCG tablet Take 88 mcg by mouth daily.     omeprazole (PRILOSEC) 40 MG capsule Take 40 mg by mouth in the morning and at bedtime.  0   Pimozide 1 MG TABS Take 1 tablet (1 mg total) by mouth 2 (two) times daily. 60 tablet 1   sertraline (ZOLOFT) 100 MG tablet Take 2 tablets (200 mg total) by mouth daily. 180 tablet 3   No current facility-administered medications for this visit.     Musculoskeletal: Strength & Muscle Tone: within normal limits and  telehealth visit Gait & Station: normal, teleheath visit Patient leans: N/A  Psychiatric Specialty Exam: Review of Systems  There were no vitals taken for this visit.There is no height or weight on file to calculate BMI.  General Appearance: Well Groomed  Eye Contact:  Good  Speech:  Clear and Coherent and Normal Rate  Volume:  Normal  Mood:  Anxious and reports that she is able to cope with her anxiety  Affect:  Congruent  Thought Process:  Coherent, Goal Directed and Linear  Orientation:  Full (Time, Place, and Person)  Thought Content: WDL and Logical   Suicidal Thoughts:  No  Homicidal Thoughts:  No  Memory:  Immediate;   Good Recent;   Good Remote;   Good  Judgement:  Good  Insight:  Good  Psychomotor Activity:  Normal  Concentration:  Concentration: Good and Attention Span: Good  Recall:  Good  Fund of Knowledge: Good  Language: Good   Akathisia:  No  Handed:  Right  AIMS (if indicated): Not done  Assets:  Communication Skills Desire for Improvement Financial Resources/Insurance Housing Social Support  ADL's:  Intact  Cognition: WNL  Sleep:  Good   Screenings: GAD-7    Flowsheet Row Video Visit from 08/07/2021 in [facility] Counselor from 05/15/2021 in [facility] Video Visit from 02/27/2021 in [facility] Video Visit from 11/27/2020 in [facility] Video Visit from 09/04/2020 in [facility]  Total GAD-7 Score 20 17 18 8 13       PHQ2-9    Flowsheet Row Video Visit from 08/07/2021 in [facility] Counselor from 05[facility]15/2021 in [facility] Video Visit from 02/27/2021 in  [facility] Video Visit from 11/27/2020 in [facility] Video Visit from 09/04/2020 in [facility]  PHQ-2 Total Score 3 1 6 1 2   PHQ-9 Total Score 11 12 21 6 5       Flowsheet Row ED from 03/31/2021 in [facility] EMERGENCY DEPARTMENT Video Visit from 02/27/2021 in [facility] Video Visit from 06/05/2020 in [facility]  C-SSRS RISK CATEGORY No Risk Error: Q7 should not be populated when Q6 is No No Risk        Assessment and Plan: Patient notes that she does experience symptoms of anxiety and mild depression however reports that she is able to cope with it.  No medication changes made today.  Patient agreeable to taking medications as prescribed.  1. Severe anxiety with panic  Continue- hydrOXYzine (ATARAX) 10 MG tablet; Take 1 tablet (10 mg total) by mouth 3 (three) times daily as needed for anxiety.  Dispense: 90 tablet; Refill: 3 Continue- sertraline (ZOLOFT) 100 MG tablet; Take 2 tablets (200  mg total) by mouth daily.  Dispense: 180 tablet; Refill: 3  2. Mild depression  Continue- sertraline (ZOLOFT) 100 MG tablet; Take 2 tablets (200 mg total) by mouth daily.  Dispense: 180 tablet; Refill: 3    Follow up in 3 month Follow-up with therapy    06/07/2020, NP 08/07/2021, 2:37 PM

## 2021-08-16 ENCOUNTER — Ambulatory Visit (HOSPITAL_COMMUNITY): Payer: Medicaid Other | Admitting: Clinical

## 2021-11-08 ENCOUNTER — Encounter (HOSPITAL_COMMUNITY): Payer: Self-pay | Admitting: Psychiatry

## 2021-11-08 ENCOUNTER — Telehealth (INDEPENDENT_AMBULATORY_CARE_PROVIDER_SITE_OTHER): Payer: Medicaid Other | Admitting: Psychiatry

## 2021-11-08 DIAGNOSIS — F323 Major depressive disorder, single episode, severe with psychotic features: Secondary | ICD-10-CM

## 2021-11-08 DIAGNOSIS — F410 Panic disorder [episodic paroxysmal anxiety] without agoraphobia: Secondary | ICD-10-CM

## 2021-11-08 MED ORDER — SERTRALINE HCL 100 MG PO TABS: 200.0000 mg | ORAL_TABLET | Freq: Every day | ORAL | 3 refills | Status: DC

## 2021-11-08 MED ORDER — TRAZODONE HCL 50 MG PO TABS: 25.0000 mg | ORAL_TABLET | Freq: Every day | ORAL | 3 refills | Status: DC

## 2021-11-08 MED ORDER — HYDROXYZINE HCL 25 MG PO TABS: 25.0000 mg | ORAL_TABLET | Freq: Three times a day (TID) | ORAL | 3 refills | Status: DC | PRN

## 2021-11-08 NOTE — Progress Notes (Signed)
[place] MD/PA/NP OP Progress Note Virtual Visit via Video Note  I connected with Erica Mcknight on 11/08/21 at  2:00 PM EDT by a video enabled telemedicine application and verified that I am speaking with the correct person using two identifiers.  Location: Patient: Store Provider: Clinic   I discussed the limitations of evaluation and management by telemedicine and the availability of in person appointments. The patient expressed understanding and agreed to proceed.  I provided 30 minutes of non-face-to-face time during this encounter.       11/08/2021 2:20 PM Erica Mcknight  MRN:  [id number]  Chief Complaint: "Sometimes I get anxious"  HPI: 21 year old female seen today for follow-up psychiatric evaluation.  Patient has a psychiatric history of ADHD, depression, Tic disorder and severe panic with anxiety. Patient is currently prescribed Pimozide1 mg two times a day(recieves from PCP), Zoloft 200 mg daily, and  hydroxyzine 10 mg three times daily. She notes her medications are somewhat effective in managing her psychiatric conditions.   Today she was well-groomed, pleasant, cooperative, engaged in conversation, and maintains eye contact.  She informed Probation officer that at times she gets anxious.  She notes that taking more hydroxyzine to help cope with her anxiety and notes that it is somewhat effective.  Patient unable to identify triggers for her anxiety.  Today provider conducted a GAD-7 and patient scored a 19, at her last visit she scored a 20.  Provider also conducted PHQ-9 patient scored a 17, at her last visit she scored an 11.  She endorses reduced appetite noting that she is on Ozempic.  She informed Probation officer that she has lost over 40 pounds.  Patient notes her sleep is poor reporting that she sleeps 4 hours nightly.  She informed Probation officer that at times she feels that she sees black figures in the corner of her eyes or hear random noises such as a creaking door.  Patient endorses passive SI however  denies wanting to harm himself today.  Today she denies SI/HI or mania.  Patient does note that she feels paranoid at times.    Patient informed Probation officer that she has back pain.  She notes that she is not taking medications for this.  She does note that she has been actively trying to lose weight and continues [facility] with this.  Today patient agreeable to increasing hydroxyzine 10 mg 3 times daily to 25 mg 3 times daily as needed to help manage anxiety.  She will start trazodone 25 mg to 50 mg nightly as needed to help manage sleep.  Provider asked patient if she would be interested in starting an antipsychotic for paranoia and hallucinations however she notes that she does not.  She will continue her other medications as prescribed and follow-up with outpatient counseling for therapy.  No other concerns noted at this time.      Visit Diagnosis:    ICD-10-CM   1. Severe major depression with psychotic features (HCC)  F32.3 traZODone (DESYREL) 50 MG tablet    sertraline (ZOLOFT) 100 MG tablet    2. Severe anxiety with panic  F41.0 sertraline (ZOLOFT) 100 MG tablet    hydrOXYzine (ATARAX) 25 MG tablet      Past Psychiatric History: ADHD, depression, personality disorder    Past Medical History:  Past Medical History:  Diagnosis Date   Anxiety    Phreesia 03/16/2020   Depression    Phreesia 03/16/2020   Diabetes mellitus without complication ([place])  Phreesia 03/16/2020   History reviewed. No pertinent surgical history.  Family Psychiatric History: Paternal grandfather alcohol abuse, paternal uncle ploy substance abuse and suicide, maternal grandmother anxiety      Family History:  Family History  Problem Relation Age of Onset   Pancreatitis Paternal Grandfather        Died at 66    Social History:  Social History   Socioeconomic History   Marital status: Single    Spouse name: Not on file   Number of children: Not on file   Years of  education: Not on file   Highest education level: Not on file  Occupational History   Not on file  Tobacco Use   Smoking status: Never   Smokeless tobacco: Never  Substance and Sexual Activity   Alcohol use: No    Alcohol/week: 0.0 standard drinks of alcohol   Drug use: No   Sexual activity: Never  Other Topics Concern   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 She is currently not in school   She lives with her parents and siblings.    She enjoys playing on her phone and sleeping.   Social Determinants of Health   Financial Resource Strain: Not on file  Food Insecurity: Not on file  Transportation Needs: Not on file  Physical Activity: Not on file  Stress: Not on file  Social Connections: Not on file    Allergies: No Known Allergies  Metabolic Disorder Labs: No results found for: "HGBA1C", "MPG" No results found for: "PROLACTIN" No results found for: "CHOL", "TRIG", "HDL", "CHOLHDL", "VLDL", "LDLCALC" No results found for: "TSH"  Therapeutic Level Labs: No results found for: "LITHIUM" No results found for: "VALPROATE" No results found for: "CBMZ"  Current Medications: Current Outpatient Medications  Medication Sig Dispense Refill   traZODone (DESYREL) 50 MG tablet Take 0.5-1 tablets (25-50 mg total) by mouth at bedtime. 30 tablet 3   Adalimumab (HUMIRA PEN) 40 MG/0.4ML PNKT Inject 40 mg into the skin every 14 (fourteen) days.     cholecalciferol (VITAMIN D3) 25 MCG (1000 UNIT) tablet Take 1,000 Units by mouth daily.     hydrOXYzine (ATARAX) 25 MG tablet Take 1 tablet (25 mg total) by mouth 3 (three) times daily as needed for anxiety. 90 tablet 3   levothyroxine (SYNTHROID) 88 MCG tablet Take 88 mcg by mouth daily.     omeprazole (PRILOSEC) 40 MG capsule Take 40 mg by mouth in the morning and at bedtime.  0   Pimozide 1 MG TABS Take 1 tablet (1 mg total) by mouth 2 (two) times daily. 60  tablet 1   sertraline (ZOLOFT) 100 MG tablet Take 2 tablets (200 mg total) by mouth daily. 180 tablet 3   No current facility-administered medications for this visit.     Musculoskeletal: Strength & Muscle Tone: within normal limits and  telehealth visit Gait & Station: normal, teleheath visit Patient leans: N/A  Psychiatric Specialty Exam: Review of Systems  There were no vitals taken for this visit.There is no height or weight on file to calculate BMI.  General Appearance: Well Groomed  Eye Contact:  Good  Speech:  Clear and Coherent and Normal Rate  Volume:  Normal  Mood:  Anxious and Depressed  Affect:  Congruent  Thought Process:  Coherent, Goal Directed and Linear  Orientation:  Full (Time, Place, and Person)  Thought Content: Logical, Hallucinations: Auditory Visual,  and Paranoid Ideation   Suicidal Thoughts:  Yes.  without intent/plan  Homicidal Thoughts:  No  Memory:  Immediate;   Good Recent;   Good Remote;   Good  Judgement:  Good  Insight:  Good  Psychomotor Activity:  Normal  Concentration:  Concentration: Good and Attention Span: Good  Recall:  Good  Fund of Knowledge: Good  Language: Good  Akathisia:  No  Handed:  Right  AIMS (if indicated): Not done  Assets:  Communication Skills Desire for Improvement Financial Resources/Insurance Housing Social Support  ADL's:  Intact  Cognition: WNL  Sleep:  Poor   Screenings: GAD-7    Flowsheet Row Video Visit from 11/08/2021 in [facility] Video Visit from 08/07/2021 in [facility] Counselor from 05/15/2021 in [facility] Video Visit from 02/27/2021 in [facility] Video Visit from 11/27/2020 in [facility]  Total GAD-7 Score 19 20 17 18 8       PHQ2-9    Flowsheet Row Video Visit from 11/08/2021 in [facility] Video Visit from 08/07/2021 in  [facility] Counselor from 05/15/2021 in [facility] Video Visit from 02/27/2021 in [facility] Video Visit from 11/27/2020 in [facility]  PHQ-2 Total Score 3 3 1 6 1   PHQ-9 Total Score 17 11 12 21 6       Flowsheet Row Video Visit from 11/08/2021 in [facility] ED from 03/31/2021 in [facility] Video Visit from 02/27/2021 in [facility]  C-SSRS RISK CATEGORY Error: Q3, 4, or 5 should not be populated when Q2 is No No Risk Error: Q7 should not be populated when Q6 is No        Assessment and Plan: Patient endorses insomnia, increased anxiety/depression, VAH, and paranoia.  Today patient agreeable to increasing hydroxyzine 10 mg 3 times daily to 25 mg 3 times daily as needed to help manage anxiety.  She will start trazodone 25 mg to 50 mg nightly as needed to help manage sleep.  Provider asked patient if she would be interested in starting an antipsychotic for paranoia and hallucinations however she notes that she does not.  She will continue her other medications as prescribed  1. Severe anxiety with panic  Continue- sertraline (ZOLOFT) 100 MG tablet; Take 2 tablets (200 mg total) by mouth daily.  Dispense: 180 tablet; Refill: 3 Increased- hydrOXYzine (ATARAX) 25 MG tablet; Take 1 tablet (25 mg total) by mouth 3 (three) times daily as needed for anxiety.  Dispense: 90 tablet; Refill: 3  2. Severe major depression with psychotic features (HCC)  Start- traZODone (DESYREL) 50 MG tablet; Take 0.5-1 tablets (25-50 mg total) by mouth at bedtime.  Dispense: 30 tablet; Refill: 3 Continue- sertraline (ZOLOFT) 100 MG tablet; Take 2 tablets (200 mg total) by mouth daily.  Dispense: 180 tablet; Refill: 3  Follow up in 3 month Follow-up with therapy    [facility], NP 11/08/2021, 2:20  PM

## 2021-11-30 ENCOUNTER — Other Ambulatory Visit (HOSPITAL_COMMUNITY): Payer: Self-pay | Admitting: Psychiatry

## 2021-11-30 DIAGNOSIS — F410 Panic disorder [episodic paroxysmal anxiety] without agoraphobia: Secondary | ICD-10-CM

## 2021-11-30 DIAGNOSIS — F323 Major depressive disorder, single episode, severe with psychotic features: Secondary | ICD-10-CM

## 2022-02-07 ENCOUNTER — Telehealth (HOSPITAL_COMMUNITY): Payer: Medicaid Other | Admitting: Psychiatry

## 2022-05-15 ENCOUNTER — Other Ambulatory Visit: Payer: Self-pay | Admitting: Family Medicine

## 2022-05-15 ENCOUNTER — Ambulatory Visit
Admission: RE | Admit: 2022-05-15 | Discharge: 2022-05-15 | Disposition: A | Discharge status: Home / Self Care | Payer: Medicaid Other | Source: Ambulatory Visit | Attending: Family Medicine | Admitting: Family Medicine

## 2022-05-15 DIAGNOSIS — S93402A Sprain of unspecified ligament of left ankle, initial encounter: Secondary | ICD-10-CM

## 2022-07-03 ENCOUNTER — Telehealth (HOSPITAL_COMMUNITY): Payer: Medicaid Other | Admitting: Psychiatry

## 2022-07-09 ENCOUNTER — Ambulatory Visit (HOSPITAL_COMMUNITY): Payer: Medicaid Other | Admitting: Psychiatry

## 2022-07-11 ENCOUNTER — Ambulatory Visit (HOSPITAL_COMMUNITY): Payer: Medicaid Other | Admitting: Clinical

## 2022-07-24 ENCOUNTER — Ambulatory Visit (INDEPENDENT_AMBULATORY_CARE_PROVIDER_SITE_OTHER): Payer: Medicaid Other | Admitting: Clinical

## 2022-07-24 DIAGNOSIS — F332 Major depressive disorder, recurrent severe without psychotic features: Secondary | ICD-10-CM | POA: Diagnosis not present

## 2022-07-24 DIAGNOSIS — F952 Tourette's disorder: Secondary | ICD-10-CM | POA: Diagnosis not present

## 2022-07-25 ENCOUNTER — Encounter (HOSPITAL_COMMUNITY): Payer: Self-pay | Admitting: Clinical

## 2022-07-25 ENCOUNTER — Encounter (HOSPITAL_COMMUNITY): Payer: Self-pay
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
## 2022-08-10 ENCOUNTER — Ambulatory Visit (HOSPITAL_COMMUNITY): Payer: Medicaid Other | Admitting: Psychiatry

## 2022-08-10 ENCOUNTER — Encounter (HOSPITAL_COMMUNITY): Payer: Self-pay | Admitting: Psychiatry

## 2022-08-10 ENCOUNTER — Ambulatory Visit (HOSPITAL_BASED_OUTPATIENT_CLINIC_OR_DEPARTMENT_OTHER): Payer: Medicaid Other | Admitting: Psychiatry

## 2022-08-10 VITALS — Wt 270.0 lb

## 2022-08-10 DIAGNOSIS — F4312 Post-traumatic stress disorder, chronic: Secondary | ICD-10-CM

## 2022-08-10 DIAGNOSIS — F410 Panic disorder [episodic paroxysmal anxiety] without agoraphobia: Secondary | ICD-10-CM | POA: Diagnosis not present

## 2022-08-10 DIAGNOSIS — F323 Major depressive disorder, single episode, severe with psychotic features: Secondary | ICD-10-CM

## 2022-08-10 MED ORDER — TRAZODONE HCL 50 MG PO TABS
ORAL_TABLET | ORAL | 0 refills | Status: AC
Start: 2022-08-10 — End: ?

## 2022-08-10 MED ORDER — HYDROXYZINE HCL 25 MG PO TABS
25.0000 mg | ORAL_TABLET | Freq: Every day | ORAL | 0 refills | Status: AC | PRN
Start: 2022-08-10 — End: ?

## 2022-08-10 MED ORDER — SERTRALINE HCL 100 MG PO TABS
200.0000 mg | ORAL_TABLET | Freq: Every day | ORAL | 0 refills | Status: AC
Start: 2022-08-10 — End: ?
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
## 2022-09-11 ENCOUNTER — Ambulatory Visit (INDEPENDENT_AMBULATORY_CARE_PROVIDER_SITE_OTHER): Payer: Medicaid Other | Admitting: Clinical

## 2022-09-11 DIAGNOSIS — Z91199 Patient's noncompliance with other medical treatment and regimen due to unspecified reason: Secondary | ICD-10-CM

## 2022-09-11 NOTE — Progress Notes (Signed)
Patient ID: Erica Mcknight, female   DOB: [birth year], 22 y.o.   MRN: [id number]  Therapy Progress Note  Patient had an appointment scheduled with therapist on 09/11/2022  at 9:00am.  When she did not arrive for the session, CSW called her at [phone] and left a voicemail.  She still did not arrive for the session at 9:30am, so was considered a no show.  As per [facility] policy, she will be be charged for this no-show appointment.    Encounter Diagnosis  Name Primary?   No-show for appointment Yes     Ambrose Mantle, LCSW 09/11/2022, 9:56 AM

## 2022-09-12 ENCOUNTER — Telehealth (HOSPITAL_COMMUNITY): Payer: Medicaid Other | Admitting: Psychiatry

## 2022-09-25 ENCOUNTER — Ambulatory Visit (INDEPENDENT_AMBULATORY_CARE_PROVIDER_SITE_OTHER): Payer: Medicaid Other | Admitting: Clinical

## 2022-09-25 DIAGNOSIS — Z91199 Patient's noncompliance with other medical treatment and regimen due to unspecified reason: Secondary | ICD-10-CM

## 2022-09-25 NOTE — Progress Notes (Signed)
Patient ID: Erica Mcknight, female   DOB: 24-Dec-2000, 22 y.o.   MRN: [id number]  Clinical Social Work Note  Patient had an appointment scheduled with therapist on 09/25/2022 at 9:00am.  When she did not arrive for the session, CSW called her at [phone] and left a voicemail.  CSW also called and left a message for her mother/guardian Devonie Badeaux at [phone].  The question was asked whether she wants to keep future appointments or turn appointments into virtual sessions.  She still did not arrive for the session at 9:30am and did not call back, was considered a no show.  As per [facility] policy, she will be charged for this no-show appointment.  Encounter Diagnoses  Name Primary?   No-show for appointment Yes    Ambrose Mantle, LCSW 09/25/2022, 9:59 AM

## 2022-10-09 ENCOUNTER — Ambulatory Visit (INDEPENDENT_AMBULATORY_CARE_PROVIDER_SITE_OTHER): Payer: Medicaid Other | Admitting: Clinical

## 2022-10-09 ENCOUNTER — Encounter (HOSPITAL_COMMUNITY): Payer: Self-pay | Admitting: Clinical

## 2022-10-09 ENCOUNTER — Telehealth (HOSPITAL_COMMUNITY): Payer: Self-pay | Admitting: Clinical

## 2022-10-09 DIAGNOSIS — Z91199 Patient's noncompliance with other medical treatment and regimen due to unspecified reason: Secondary | ICD-10-CM

## 2022-10-09 NOTE — Telephone Encounter (Signed)
This patient has been discharged due to non compliance and missed appointments

## 2022-10-09 NOTE — Progress Notes (Signed)
Patient ID: Erica Mcknight, female   DOB: [birth year], 22 y.o.   MRN: [id number]  Therapy Progress Note  Patient had an appointment scheduled with therapist on 10/09/2022  at 9:00am.  CSW called patient at 9:05 and her mother/guardian at 9:06, leaving a voicemail at both.  Se did not arrive for the session by 9:30am, so was considered a no show.  This is her 3rd no show in a row, so as per [facility] policy, her case will be closed out.  She will be be charged for this no-show appointment.    Encounter Diagnosis  Name Primary?   No-show for appointment Yes     Ambrose Mantle, LCSW 10/09/2022, 5:05 PM

## 2022-10-16 ENCOUNTER — Ambulatory Visit (HOSPITAL_COMMUNITY): Payer: Medicaid Other | Admitting: Clinical

## 2022-10-22 ENCOUNTER — Telehealth (HOSPITAL_COMMUNITY): Payer: MEDICAID | Admitting: Psychiatry

## 2022-10-30 ENCOUNTER — Ambulatory Visit (HOSPITAL_COMMUNITY): Payer: Medicaid Other | Admitting: Clinical

## 2022-12-02 ENCOUNTER — Telehealth (HOSPITAL_BASED_OUTPATIENT_CLINIC_OR_DEPARTMENT_OTHER): Payer: Self-pay | Admitting: Psychiatry

## 2022-12-02 ENCOUNTER — Encounter (HOSPITAL_COMMUNITY): Payer: Self-pay

## 2022-12-02 DIAGNOSIS — Z91199 Patient's noncompliance with other medical treatment and regimen due to unspecified reason: Secondary | ICD-10-CM

## 2022-12-02 NOTE — Progress Notes (Signed)
Patient no showed.  Left a message to reschedule appointment.

## 2023-12-29 IMAGING — US US BREAST*R* LIMITED INC AXILLA
1 series · 12 of 12 positions shown · non-contrast
Comparison: None

CLINICAL DATA: Evaluate for abscess.

EXAM:
ULTRASOUND OF THE right BREAST

[Series 1: [facility] breast [facility] right inc axilla · 12 of 12 slices shown]
[im 1/12]
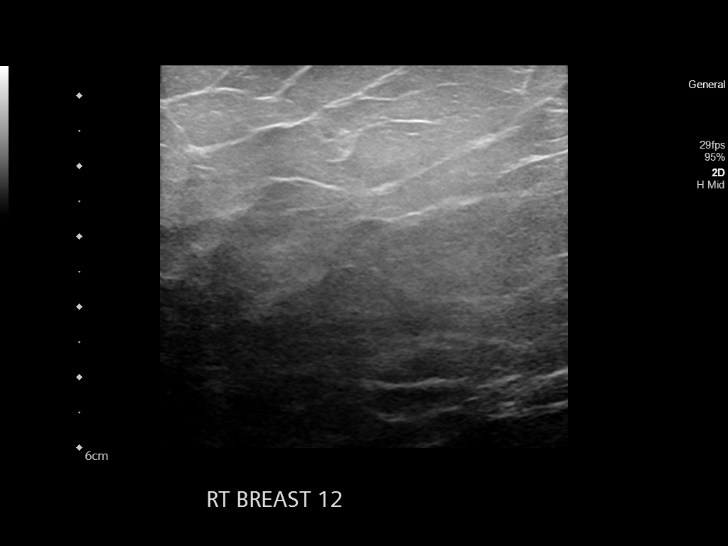
[im 2/12]
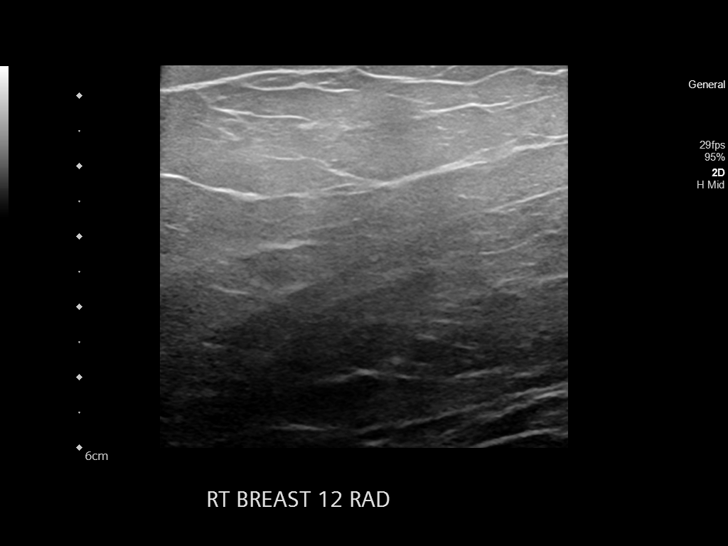
[im 3/12]
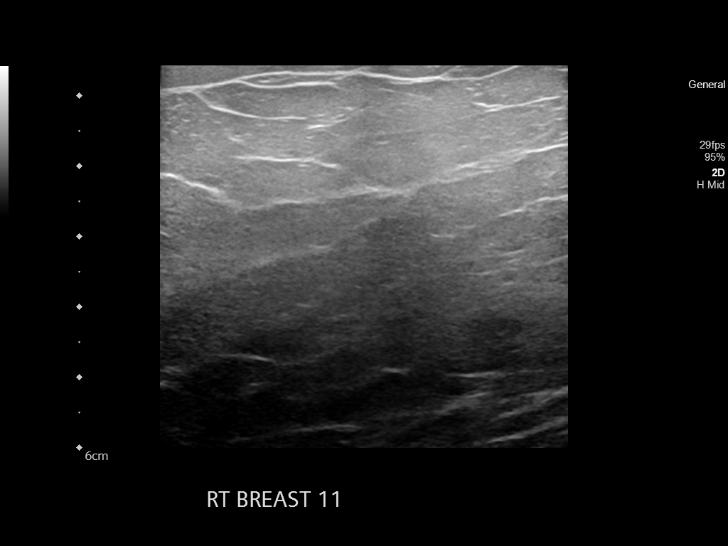
[im 4/12]
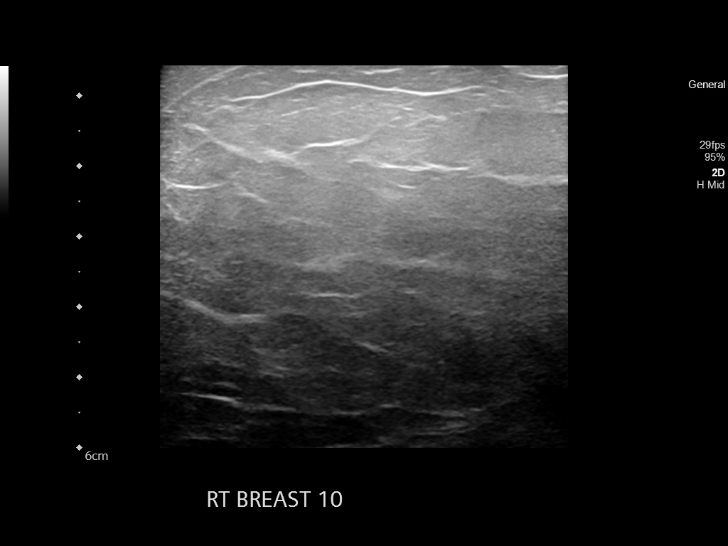
[im 5/12]
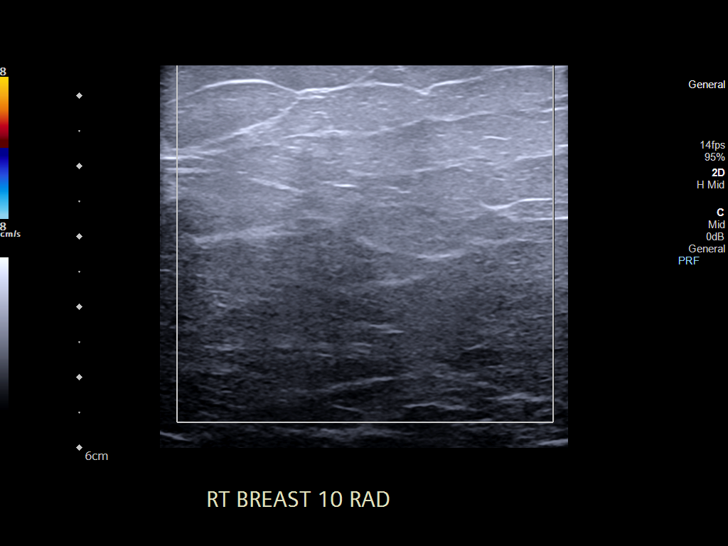
[im 6/12]
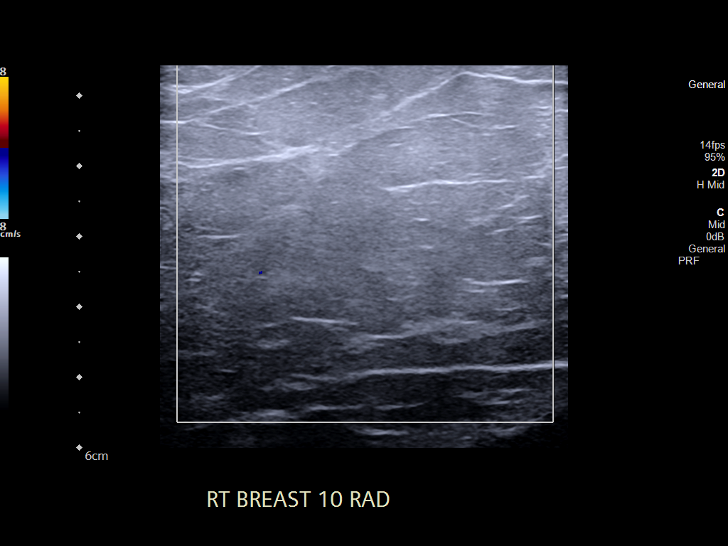
[im 7/12]
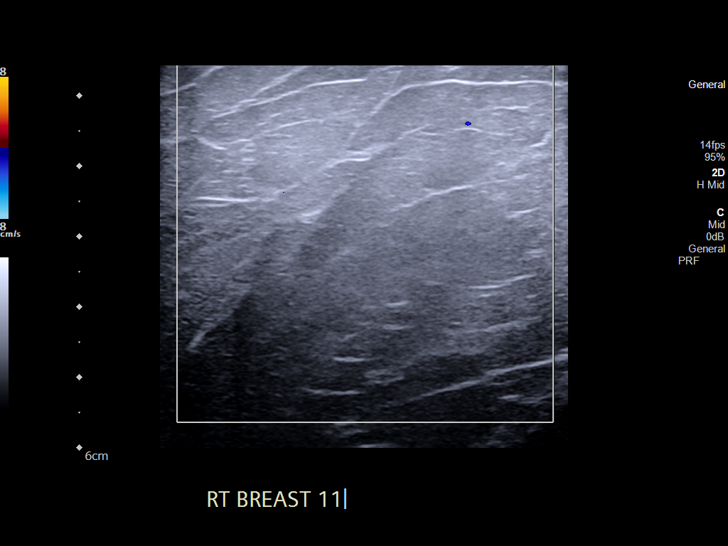
[im 8/12]
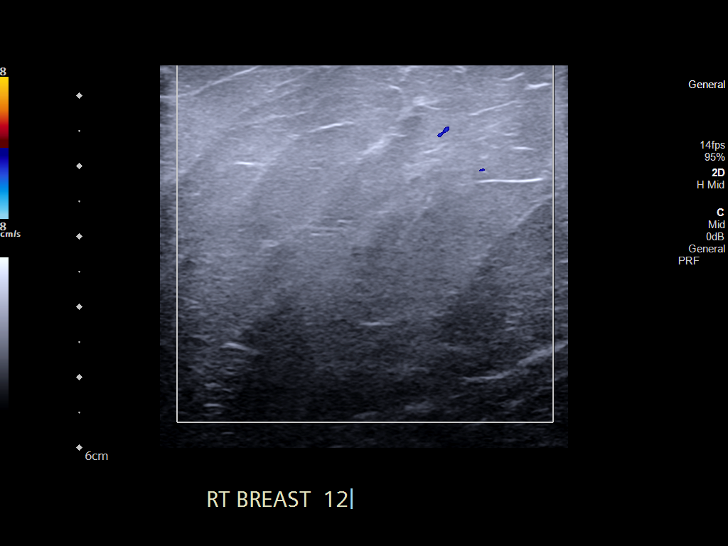
[im 9/12]
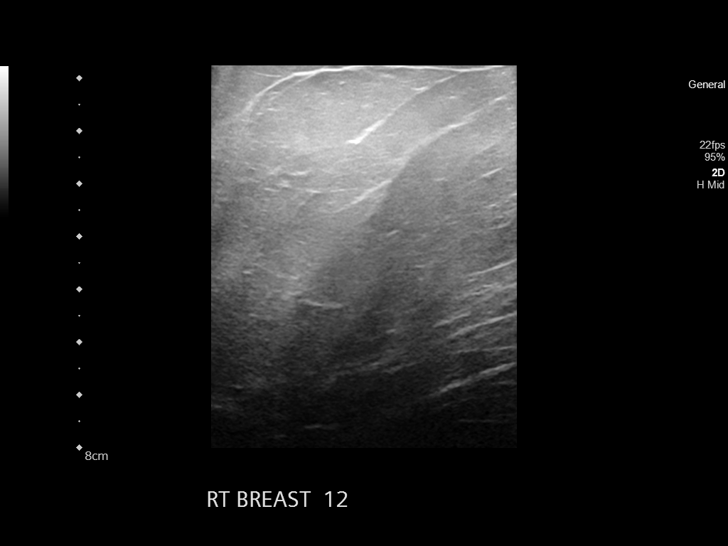
[im 10/12]
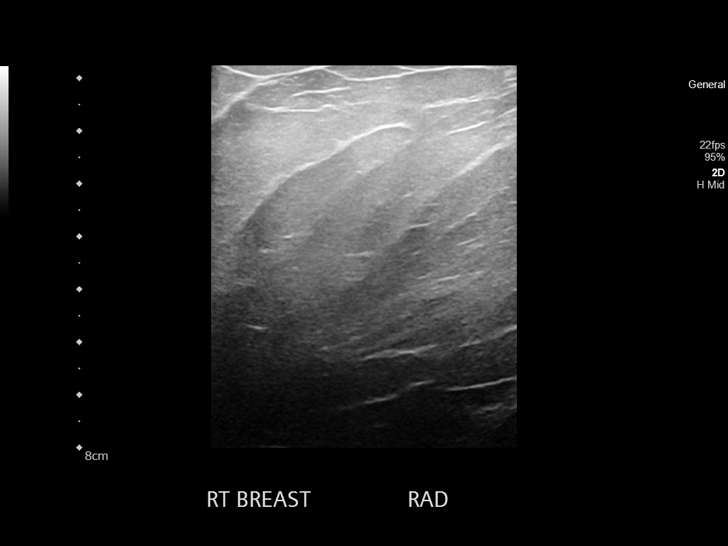
[im 11/12]
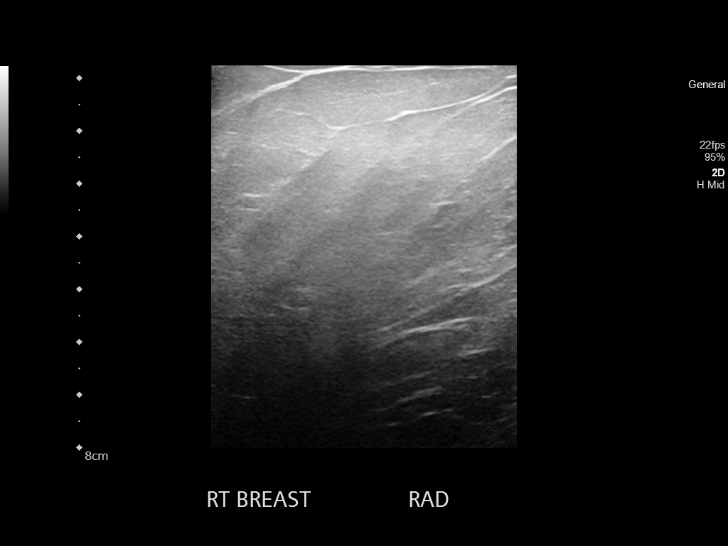
[im 12/12]
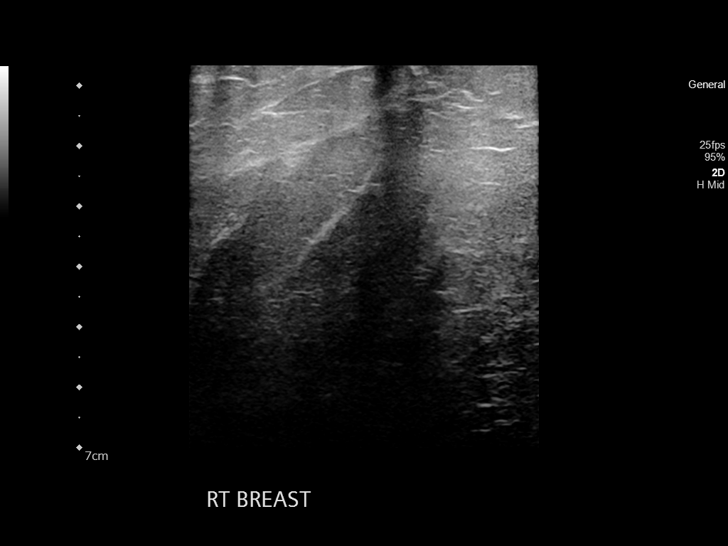

[12 of 12 positions shown; findings below may reference images not displayed]

FINDINGS: Targeted sonographic images of the right breast was performed for
evaluation of abscess.

No drainable fluid collection or abscess identified. No
architectural distortion or abnormal vascularity.
IMPRESSION: No abscess.
